# Patient Record
Sex: Male | Born: 1970 | Race: White | Hispanic: Yes | Marital: Married | Smoking: Never smoker
Health system: Southern US, Community
[De-identification: ages and names within clinical notes are randomized; demographics above are authoritative.]

## PROBLEM LIST (undated history)

## (undated) DIAGNOSIS — J302 Other seasonal allergic rhinitis: Secondary | ICD-10-CM

## (undated) DIAGNOSIS — K219 Gastro-esophageal reflux disease without esophagitis: Secondary | ICD-10-CM

## (undated) DIAGNOSIS — J309 Allergic rhinitis, unspecified: Secondary | ICD-10-CM

## (undated) DIAGNOSIS — Z973 Presence of spectacles and contact lenses: Secondary | ICD-10-CM

## (undated) DIAGNOSIS — M1990 Unspecified osteoarthritis, unspecified site: Secondary | ICD-10-CM

## (undated) DIAGNOSIS — N4340 Spermatocele of epididymis, unspecified: Secondary | ICD-10-CM

## (undated) DIAGNOSIS — J45991 Cough variant asthma: Secondary | ICD-10-CM

## (undated) HISTORY — PX: HERNIA REPAIR: SHX51

## (undated) HISTORY — DX: Other seasonal allergic rhinitis: J30.2

## (undated) HISTORY — PX: LAPAROSCOPIC CHOLECYSTECTOMY: SUR755

## (undated) HISTORY — PX: CHOLECYSTECTOMY: SHX55

## (undated) HISTORY — PX: INGUINAL HERNIA REPAIR: SUR1180

---

## 2004-12-14 ENCOUNTER — Emergency Department (HOSPITAL_COMMUNITY): Admission: EM | Admit: 2004-12-14 | Discharge: 2004-12-14 | Payer: Self-pay | Admitting: Emergency Medicine

## 2004-12-16 ENCOUNTER — Encounter: Admission: RE | Admit: 2004-12-16 | Discharge: 2004-12-16 | Payer: Self-pay | Admitting: Family Medicine

## 2004-12-29 ENCOUNTER — Ambulatory Visit (HOSPITAL_COMMUNITY): Admission: RE | Admit: 2004-12-29 | Discharge: 2004-12-29 | Payer: Self-pay | Admitting: Family Medicine

## 2007-02-01 ENCOUNTER — Emergency Department (HOSPITAL_COMMUNITY): Admission: EM | Admit: 2007-02-01 | Discharge: 2007-02-01 | Payer: Self-pay | Admitting: Emergency Medicine

## 2007-04-09 ENCOUNTER — Ambulatory Visit (HOSPITAL_COMMUNITY): Admission: RE | Admit: 2007-04-09 | Discharge: 2007-04-09 | Payer: Self-pay | Admitting: Sports Medicine

## 2007-04-11 DIAGNOSIS — R0789 Other chest pain: Secondary | ICD-10-CM | POA: Insufficient documentation

## 2007-04-16 ENCOUNTER — Ambulatory Visit: Payer: Self-pay | Admitting: Gastroenterology

## 2007-04-17 ENCOUNTER — Ambulatory Visit: Payer: Self-pay | Admitting: Gastroenterology

## 2007-04-20 ENCOUNTER — Ambulatory Visit (HOSPITAL_COMMUNITY): Admission: RE | Admit: 2007-04-20 | Discharge: 2007-04-20 | Payer: Self-pay | Admitting: Gastroenterology

## 2007-06-26 ENCOUNTER — Encounter: Admission: RE | Admit: 2007-06-26 | Discharge: 2007-06-26 | Payer: Self-pay | Admitting: Sports Medicine

## 2007-08-28 ENCOUNTER — Emergency Department (HOSPITAL_COMMUNITY): Admission: EM | Admit: 2007-08-28 | Discharge: 2007-08-28 | Payer: Self-pay | Admitting: Emergency Medicine

## 2008-05-14 ENCOUNTER — Encounter: Payer: Self-pay | Admitting: Gastroenterology

## 2008-06-12 ENCOUNTER — Encounter: Payer: Self-pay | Admitting: Gastroenterology

## 2008-07-01 ENCOUNTER — Encounter: Payer: Self-pay | Admitting: Gastroenterology

## 2008-07-17 ENCOUNTER — Ambulatory Visit: Payer: Self-pay | Admitting: Gastroenterology

## 2008-07-17 DIAGNOSIS — K7689 Other specified diseases of liver: Secondary | ICD-10-CM

## 2008-07-17 DIAGNOSIS — K922 Gastrointestinal hemorrhage, unspecified: Secondary | ICD-10-CM | POA: Insufficient documentation

## 2008-07-23 ENCOUNTER — Ambulatory Visit: Payer: Self-pay | Admitting: Gastroenterology

## 2008-07-23 ENCOUNTER — Encounter: Payer: Self-pay | Admitting: Gastroenterology

## 2008-07-24 ENCOUNTER — Telehealth: Payer: Self-pay | Admitting: Gastroenterology

## 2008-07-25 ENCOUNTER — Encounter: Payer: Self-pay | Admitting: Gastroenterology

## 2008-12-08 ENCOUNTER — Ambulatory Visit: Payer: Self-pay | Admitting: Internal Medicine

## 2008-12-08 DIAGNOSIS — J45991 Cough variant asthma: Secondary | ICD-10-CM

## 2009-07-27 ENCOUNTER — Ambulatory Visit: Payer: Self-pay | Admitting: Pulmonary Disease

## 2009-07-27 ENCOUNTER — Telehealth (INDEPENDENT_AMBULATORY_CARE_PROVIDER_SITE_OTHER): Payer: Self-pay | Admitting: *Deleted

## 2009-07-27 DIAGNOSIS — J019 Acute sinusitis, unspecified: Secondary | ICD-10-CM | POA: Insufficient documentation

## 2011-02-08 NOTE — Assessment & Plan Note (Signed)
[facility]                         GASTROENTEROLOGY OFFICE NOTE   NAME:Jeremiah Williams, Jeremiah Williams                      MRN:          [id number]  DATE:04/16/2007                            DOB:          12-04-1970    OFFICE CONSULTATION:   REASON FOR REFERRAL:  Chest pain and epigastric pain.   HISTORY OF PRESENT ILLNESS:  [name] is a 40 year old white male  referred through the courtesy of Dr. Frazier Butt.  The patient relates a  two year history of intermittent lower chest pain and epigastric pain.  His symptoms are not related to exertion or any digestive function. He  states that he feels short of breath at times during the episodes of  pain.  His pain tends to last for hours at a time and sometimes may last  for several days.  On one occasion his symptoms were associated with  nausea and vomiting.  Occasionally the pain has radiated through to his  midback.  He notes no dysphagia, odynophagia, change in bowel habits,  melena, hematochezia, hematemesis, change in stool caliber, diarrhea,  constipation or weight loss.  He has had two visits to the [facility] emergency dept. and the most recent was in May 2008.  A chest x-  ray, CBC and cardiac enzymes were all negative at that visit.  A recent  abdominal ultrasound showed fatty infiltration of the liver and the  pancreas was not well-imaged.  The remainder of the exam was normal.  He  has used Tums on occasion for these symptoms with no change in symptoms.  He was recently placed on Protonix, also with no change in symptoms. He  states he had a stress test about 2 years ago that was normal. 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  He denies any  aspirin and NSAID usage. He drinks a modest amount of alcohol.   FAMILY HISTORY:  Remarkable for two maternal uncles with pancreatic  cancer and two other maternal uncles who have had colon resections for  unknown disorders.   There is no family history of colon cancer, colon  polyps, or inflammatory bowel disease.   PAST MEDICAL HISTORY:  Status post bilateral inguinal hernia repairs as  a child and again at age 42.   CURRENT MEDICATIONS:  1. Protonix 40 mg p.o. q.a.m.  2. Tums p.r.n.   MEDICATION ALLERGIES:  None known.   Social history and review of systems per the handwritten form.   PHYSICAL EXAMINATION:  Overweight, in no acute distress.  Height 5 feet 4 inches, weight 225.8 pounds.  Blood pressure is 110/78,  pulse 76 and regular.  HEENT:  Anicteric sclerae.  Oropharynx clear.  NECK:  Without thyromegaly or adenopathy appreciated.  CHEST:  Clear to auscultation bilaterally.  There is minimal tenderness  over his xiphoid process and lower sternum.  CARDIAC:  Regular rate and rhythm without murmurs appreciated.  ABDOMEN:  Soft, nontender, nondistended, normoactive bowel sounds.  No  palpable organomegaly, masses or hernias.  RECTAL:  Deferred.  NEUROLOGIC:  Alert and oriented x3.  Grossly nonfocal.   ASSESSMENT AND PLAN:  Intermittent lower chest pain with xiphoid area  tenderness on exam today.  His symptoms have been associated with  shortness of breath on occasion.  Rule out musculosketetal pain and  other nongastrointestinal disorders. His symptoms do not have typical  features of a gastrointestinal disorder and have not responded to Tums  and Protonix; however, GERD, esophagitis, ulcer disease, gastritis,  duodenitis and acalculous cholecystitis need to be further excluded.  Proceed with upper endoscopy.  Risks, benefits, and alternatives to  upper endoscopy with possible biopsy discussed with the patient and he  consents to proceed.  This will be scheduled electively.  If this  examination is not diagnostic, we will proceed with a CCK-stimulated  hepatobiliary scan. If the above evalutation is negative he will return  to Dr. Margaretha Sheffield for further management.     Venita Lick. Russella Dar, MD,  [facility]  Electronically Signed    MTS/MedQ  DD: 04/16/2007  DT: 04/16/2007  Job #: [id number]   cc:   Frazier Butt, D.O.

## 2011-07-04 LAB — I-STAT 8, (EC8 V) (CONVERTED LAB)
BUN: 7
Hemoglobin: 16
Sodium: 139
pCO2, Ven: 37.9 — ABNORMAL LOW

## 2011-07-04 LAB — URINALYSIS, ROUTINE W REFLEX MICROSCOPIC
Hgb urine dipstick: NEGATIVE
Ketones, ur: NEGATIVE
Specific Gravity, Urine: 1.017
pH: 7.5

## 2011-07-04 LAB — POCT CARDIAC MARKERS
Operator id: 294341
Troponin i, poc: <0.05

## 2011-07-04 LAB — DIFFERENTIAL
Lymphocytes Relative: 28
Lymphs Abs: 2.2
Monocytes Absolute: 0.7
Monocytes Relative: 9
Neutrophils Relative %: 62

## 2011-07-04 LAB — CBC: MCV: 90.5

## 2011-07-04 LAB — POCT I-STAT CREATININE: Creatinine, Ser: 1

## 2011-09-27 HISTORY — PX: LAPAROSCOPIC CHOLECYSTECTOMY: SUR755

## 2013-03-02 ENCOUNTER — Emergency Department (HOSPITAL_COMMUNITY)
Admission: EM | Admit: 2013-03-02 | Discharge: 2013-03-03 | Disposition: A | Discharge status: Home / Self Care | Payer: 59 | Attending: Emergency Medicine | Admitting: Emergency Medicine

## 2013-03-02 DIAGNOSIS — S0181XA Laceration without foreign body of other part of head, initial encounter: Secondary | ICD-10-CM

## 2013-03-02 DIAGNOSIS — S335XXA Sprain of ligaments of lumbar spine, initial encounter: Secondary | ICD-10-CM | POA: Insufficient documentation

## 2013-03-02 DIAGNOSIS — S01501A Unspecified open wound of lip, initial encounter: Secondary | ICD-10-CM | POA: Insufficient documentation

## 2013-03-02 DIAGNOSIS — Z23 Encounter for immunization: Secondary | ICD-10-CM | POA: Insufficient documentation

## 2013-03-02 DIAGNOSIS — S20229A Contusion of unspecified back wall of thorax, initial encounter: Secondary | ICD-10-CM

## 2013-03-02 DIAGNOSIS — S161XXA Strain of muscle, fascia and tendon at neck level, initial encounter: Secondary | ICD-10-CM

## 2013-03-02 DIAGNOSIS — Y9389 Activity, other specified: Secondary | ICD-10-CM | POA: Insufficient documentation

## 2013-03-02 DIAGNOSIS — Y92410 Unspecified street and highway as the place of occurrence of the external cause: Secondary | ICD-10-CM | POA: Insufficient documentation

## 2013-03-02 DIAGNOSIS — IMO0002 Reserved for concepts with insufficient information to code with codable children: Secondary | ICD-10-CM | POA: Insufficient documentation

## 2013-03-02 DIAGNOSIS — S0180XA Unspecified open wound of other part of head, initial encounter: Secondary | ICD-10-CM | POA: Insufficient documentation

## 2013-03-02 DIAGNOSIS — M545 Low back pain: Secondary | ICD-10-CM

## 2013-03-02 DIAGNOSIS — S39012A Strain of muscle, fascia and tendon of lower back, initial encounter: Secondary | ICD-10-CM

## 2013-03-02 DIAGNOSIS — S139XXA Sprain of joints and ligaments of unspecified parts of neck, initial encounter: Secondary | ICD-10-CM | POA: Insufficient documentation

## 2013-03-02 MED ORDER — NAPROXEN 250 MG PO TABS
500.0000 mg | ORAL_TABLET | Freq: Once | ORAL | Status: AC
  Administered 2013-03-03: 500 mg via ORAL
  Filled 2013-03-02: qty 2

## 2013-03-02 MED ORDER — NAPROXEN 500 MG PO TABS: 500.0000 mg | ORAL_TABLET | Freq: Two times a day (BID) | ORAL | Status: DC | PRN

## 2013-03-02 MED ORDER — TETANUS-DIPHTH-ACELL PERTUSSIS 5-2.5-18.5 LF-MCG/0.5 IM SUSP
0.5000 mL | Freq: Once | INTRAMUSCULAR | Status: AC
  Administered 2013-03-03: 0.5 mL via INTRAMUSCULAR
  Filled 2013-03-02: qty 0.5

## 2013-03-02 MED ORDER — HYDROCODONE-ACETAMINOPHEN 5-325 MG PO TABS: 1.0000 | ORAL_TABLET | Freq: Four times a day (QID) | ORAL | Status: DC | PRN

## 2013-03-02 MED ORDER — METHOCARBAMOL 750 MG PO TABS: 750.0000 mg | ORAL_TABLET | Freq: Four times a day (QID) | ORAL | Status: DC | PRN

## 2013-03-02 MED ORDER — OXYCODONE-ACETAMINOPHEN 5-325 MG PO TABS
2.0000 | ORAL_TABLET | Freq: Once | ORAL | Status: AC
  Administered 2013-03-03: 2 via ORAL
  Filled 2013-03-02: qty 2

## 2013-03-02 MED ORDER — DIAZEPAM 5 MG PO TABS
10.0000 mg | ORAL_TABLET | Freq: Once | ORAL | Status: AC
  Administered 2013-03-03: 10 mg via ORAL
  Filled 2013-03-02: qty 2
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
## 2013-03-03 ENCOUNTER — Emergency Department (HOSPITAL_COMMUNITY): Payer: 59

## 2013-03-03 NOTE — ED Provider Notes (Signed)
  Medical screening examination/treatment/procedure(s) were performed by non-physician practitioner and as supervising physician I was immediately available for consultation/collaboration.    Vida Roller, MD 03/03/13 2116

## 2013-08-09 ENCOUNTER — Other Ambulatory Visit: Payer: Self-pay | Admitting: Otolaryngology

## 2013-08-09 DIAGNOSIS — R221 Localized swelling, mass and lump, neck: Secondary | ICD-10-CM

## 2013-08-14 ENCOUNTER — Ambulatory Visit
Admission: RE | Admit: 2013-08-14 | Discharge: 2013-08-14 | Disposition: A | Discharge status: Home / Self Care | Payer: 59 | Source: Ambulatory Visit | Attending: Otolaryngology | Admitting: Otolaryngology

## 2013-08-14 DIAGNOSIS — R221 Localized swelling, mass and lump, neck: Secondary | ICD-10-CM

## 2013-10-07 ENCOUNTER — Encounter (HOSPITAL_COMMUNITY): Payer: Self-pay | Admitting: Emergency Medicine

## 2013-10-07 ENCOUNTER — Emergency Department (HOSPITAL_COMMUNITY)
Admission: EM | Admit: 2013-10-07 | Discharge: 2013-10-07 | Disposition: A | Discharge status: Home / Self Care | Payer: 59 | Attending: Emergency Medicine | Admitting: Emergency Medicine

## 2013-10-07 ENCOUNTER — Emergency Department (HOSPITAL_COMMUNITY): Payer: 59

## 2013-10-07 DIAGNOSIS — R51 Headache: Secondary | ICD-10-CM | POA: Insufficient documentation

## 2013-10-07 DIAGNOSIS — H5340 Unspecified visual field defects: Secondary | ICD-10-CM | POA: Insufficient documentation

## 2013-10-07 DIAGNOSIS — R519 Headache, unspecified: Secondary | ICD-10-CM

## 2013-10-07 LAB — POCT I-STAT, CHEM 8
BUN: 7 mg/dL (ref 6–23)
CALCIUM ION: 1.19 mmol/L (ref 1.12–1.23)
CREATININE: 1.1 mg/dL (ref 0.50–1.35)
Chloride: 101 mEq/L (ref 96–112)
GLUCOSE: 106 mg/dL — AB (ref 70–99)
HCT: 44 % (ref 39.0–52.0)
HEMOGLOBIN: 15 g/dL (ref 13.0–17.0)
Potassium: 3.7 mEq/L (ref 3.7–5.3)
Sodium: 140 mEq/L (ref 137–147)
TCO2: 29 mmol/L (ref 0–100)

## 2013-10-07 LAB — CSF CELL COUNT WITH DIFFERENTIAL
RBC COUNT CSF: 22 /mm3 — AB
RBC Count, CSF: 29 /mm3 — ABNORMAL HIGH
Tube #: 1
WBC CSF: 0 /mm3 (ref 0–5)
WBC, CSF: 1 /mm3 (ref 0–5)

## 2013-10-07 LAB — CBC WITH DIFFERENTIAL/PLATELET
Basophils Absolute: 0 10*3/uL (ref 0.0–0.1)
Basophils Relative: 0 % (ref 0–1)
EOS ABS: 0.1 10*3/uL (ref 0.0–0.7)
Eosinophils Relative: 1 % (ref 0–5)
HEMATOCRIT: 42.8 % (ref 39.0–52.0)
HEMOGLOBIN: 15.6 g/dL (ref 13.0–17.0)
LYMPHS ABS: 1.4 10*3/uL (ref 0.7–4.0)
Lymphocytes Relative: 16 % (ref 12–46)
MCH: 33.3 pg (ref 26.0–34.0)
MCHC: 36.4 g/dL — AB (ref 30.0–36.0)
MCV: 91.3 fL (ref 78.0–100.0)
MONO ABS: 0.7 10*3/uL (ref 0.1–1.0)
MONOS PCT: 7 % (ref 3–12)
NEUTROS PCT: 76 % (ref 43–77)
Neutro Abs: 6.7 10*3/uL (ref 1.7–7.7)
Platelets: 233 10*3/uL (ref 150–400)
RBC: 4.69 MIL/uL (ref 4.22–5.81)
RDW: 13 % (ref 11.5–15.5)
WBC: 8.8 10*3/uL (ref 4.0–10.5)

## 2013-10-07 LAB — GRAM STAIN

## 2013-10-07 LAB — PROTEIN, CSF: Total  Protein, CSF: 32 mg/dL (ref 15–45)

## 2013-10-07 LAB — GLUCOSE, CSF: Glucose, CSF: 58 mg/dL (ref 43–76)

## 2013-10-07 MED ORDER — SODIUM CHLORIDE 0.9 % IV BOLUS (SEPSIS)
1000.0000 mL | Freq: Once | INTRAVENOUS | Status: AC
  Administered 2013-10-07: 1000 mL via INTRAVENOUS

## 2013-10-07 MED ORDER — METOCLOPRAMIDE HCL 5 MG/ML IJ SOLN
10.0000 mg | Freq: Once | INTRAMUSCULAR | Status: AC
  Administered 2013-10-07: 10 mg via INTRAVENOUS
  Filled 2013-10-07: qty 2

## 2013-10-07 MED ORDER — DIPHENHYDRAMINE HCL 50 MG/ML IJ SOLN
25.0000 mg | Freq: Once | INTRAMUSCULAR | Status: AC
  Administered 2013-10-07: 25 mg via INTRAVENOUS
  Filled 2013-10-07: qty 1

## 2013-10-07 MED ORDER — IOHEXOL 350 MG/ML SOLN
50.0000 mL | Freq: Once | INTRAVENOUS | Status: AC | PRN
  Administered 2013-10-07: 75 mL via INTRAVENOUS

## 2013-10-07 MED ORDER — KETOROLAC TROMETHAMINE 30 MG/ML IJ SOLN
30.0000 mg | Freq: Once | INTRAMUSCULAR | Status: AC
  Administered 2013-10-07: 30 mg via INTRAVENOUS
  Filled 2013-10-07: qty 1

## 2013-10-07 NOTE — ED Notes (Signed)
Pt signed consent form. LP tray at bedside.

## 2013-10-07 NOTE — ED Notes (Signed)
Pt reports lifting weights during his lunch breat at work and developed "the worst headache ever". Called EMS. Headache was 10/10 but now 3/10. Pt neuro intact, alert and oriented x 4.

## 2013-10-07 NOTE — ED Notes (Signed)
Pt in radiology 
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
## 2013-10-07 NOTE — ED Notes (Signed)
Pt returned from radiology.

## 2013-10-07 NOTE — ED Notes (Signed)
Pt here for headache on set after working out today and taking a shower, pt sts never had headache that bad before, pt reports seeing spots and dizzy.

## 2013-10-07 NOTE — Procedures (Signed)
CLINICAL DATA: [Severe headache.  ]  EXAM:  DIAGNOSTIC LUMBAR PUNCTURE UNDER FLUOROSCOPIC GUIDANCE  FLUOROSCOPY TIME: [0 min, 30 seconds]  PROCEDURE:  I discussed the risks (including hemorrhage, infection, headache, and nerve damage, among others), benefits, and alternatives to fluoroscopically guided lumbar puncture with the patient.  We specifically discussed the high technical likelihood of success of the procedure. The patient understood and elected to undergo the procedure.      Standard time-out was employed.  Following sterile skin prep and local anesthetic administration consisting of 1 percent lidocaine, a 22 gauge spinal needle was advanced without difficulty into the thecal sac at the at the [L3-4] level.  Clear CSF was returned.  Opening pressure was [21 cm of water].      12 cc of clear CSF was collected.  The needle was subsequently removed and the skin cleansed and bandaged.  No immediate complications were observed.       IMPRESSION: [ Technically successful fluoroscopically guided lumbar puncture.  Clear CSF was returned in sent to the lab.  Opening pressure minimally elevated at 21 cm of water.   ]

## 2013-10-07 NOTE — ED Provider Notes (Signed)
CSN: [id number]     Arrival date & time 10/07/13  1254 History   First MD Initiated Contact with Patient 10/07/13 1255     Chief Complaint  Patient presents with  . Headache   HPI  43 y/o male with no significant past medical history who presents with cc of headache. The patient was in his usual state of health until this afternoon when he developed a sudden onset headache. The patient states he lifted weights and about 10 minutes later began to take a shower when he had a sudden onset of a sharp, 10/10, frontal headache which he describes as the worst headache of his life. He denies any fevers, chills, vomiting, neck stiffness, numbness or weakness. He has had associated blurry vision.   History reviewed. No pertinent past medical history. History reviewed. No pertinent past surgical history. History reviewed. No pertinent family history. History  Substance Use Topics  . Smoking status: Never Smoker   . Smokeless tobacco: Not on file  . Alcohol Use: Yes     Comment: social    Review of Systems  Constitutional: Negative for fever and chills.  Eyes: Positive for visual disturbance. Negative for pain.  Respiratory: Negative for shortness of breath.   Cardiovascular: Negative for chest pain.  Gastrointestinal: Negative for nausea, vomiting and abdominal pain.  Neurological: Positive for headaches. Negative for weakness and numbness.  All other systems reviewed and are negative.    Allergies  Review of patient's allergies indicates no known allergies.  Home Medications   Current Outpatient Rx  Name  Route  Sig  Dispense  Refill  . dextromethorphan-guaiFENesin (MUCINEX DM) 30-600 MG per 12 hr tablet   Oral   Take 1 tablet by mouth 2 (two) times daily.         . Pseudoephedrine-APAP-DM (DAYQUIL PO)   Oral   Take 15 mLs by mouth every 4 (four) hours as needed (cold and flu symptoms).          BP 114/54  Pulse 66  Temp(Src) 98.4 F (36.9 C) (Oral)  Resp 14  Ht 5\' 4"   (1.626 m)  Wt 210 lb (95.255 kg)  BMI 36.03 kg/m2  SpO2 96% Physical Exam  Constitutional: He is oriented to person, place, and time. He appears well-developed and well-nourished. No distress.  HENT:  Head: Normocephalic and atraumatic.  Mouth/Throat: No oropharyngeal exudate.  Eyes: Conjunctivae are normal. Pupils are equal, round, and reactive to light.  Neck: Normal range of motion. Neck supple.  Cardiovascular: Normal rate and normal heart sounds.  Exam reveals no gallop and no friction rub.   No murmur heard. Pulmonary/Chest: Effort normal and breath sounds normal.  Abdominal: Soft. He exhibits no distension. There is no tenderness.  Musculoskeletal: Normal range of motion. He exhibits no edema and no tenderness.  Neurological: He is alert and oriented to person, place, and time. He has normal strength and normal reflexes. No cranial nerve deficit or sensory deficit. Coordination normal. GCS eye subscore is 4. GCS verbal subscore is 5. GCS motor subscore is 6.  Skin: Skin is warm and dry.    ED Course  LUMBAR PUNCTURE Date/Time: 10/07/2013 9:33 PM Performed by: Shanon Ace Authorized by: Shanon Ace Consent: Verbal consent obtained. written consent obtained. Risks and benefits: risks, benefits and alternatives were discussed Consent given by: patient Patient understanding: patient states understanding of the procedure being performed Patient identity confirmed: verbally with patient Time out: Immediately prior to procedure a "time out" was called to  verify the correct patient, procedure, equipment, support staff and site/side marked as required. Indications: evaluation for subarachnoid hemorrhage Anesthesia: local infiltration Local anesthetic: lidocaine 1% without epinephrine Anesthetic total: 5 ml Preparation: Patient was prepped and draped in the usual sterile fashion. Lumbar space: L4-L5 interspace Patient's position: left lateral decubitus Needle gauge:  20 Needle type: spinal needle - Quincke tip Needle length: 3.5 in Number of attempts: 2 Fluid appearance: bloody (<1 cc of blooy fluid returned. ) Tubes of fluid: No fluid was collected as fluid appeared to be traumatic.  Post-procedure: site cleaned and adhesive bandage applied   (including critical care time) Labs Review Labs Reviewed  CBC WITH DIFFERENTIAL - Abnormal; Notable for the following:    MCHC 36.4 (*)    All other components within normal limits  CSF CELL COUNT WITH DIFFERENTIAL - Abnormal; Notable for the following:    Appearance, CSF CLEAR (*)    RBC Count, CSF 29 (*)    All other components within normal limits  CSF CELL COUNT WITH DIFFERENTIAL - Abnormal; Notable for the following:    Appearance, CSF CLEAR (*)    RBC Count, CSF 22 (*)    All other components within normal limits  POCT I-STAT, CHEM 8 - Abnormal; Notable for the following:    Glucose, Bld 106 (*)    All other components within normal limits  GRAM STAIN  CSF CULTURE  PROTEIN, CSF  GLUCOSE, CSF   Imaging Review Ct Angio Head W/cm &/or Wo Cm  10/07/2013   CLINICAL DATA:  Worst headache of life this morning, since resolved.  EXAM: CT ANGIOGRAPHY HEAD  TECHNIQUE: Multidetector CT imaging of the head was performed using the standard protocol during bolus administration of intravenous contrast. Multiplanar CT image reconstructions including MIPs were obtained to evaluate the vascular anatomy.  CONTRAST:  50 mL Omnipaque 350  COMPARISON:  None.  FINDINGS: Nonvascular: The ventricles and sulci are within normal limits for age. There is no evidence of acute infarct, intracranial hemorrhage, mass, midline shift, or extra-axial collection. There is no abnormal enhancement. The orbits are unremarkable. The mastoid air cells are clear. Mild bilateral maxillary and sphenoid sinus mucosal thickening is noted. There is no evidence of acute fracture.  Vascular: The visualized distal vertebral arteries are patent. Left  vertebral artery is dominant. PICA origins are patent bilaterally. AICA origins are patent. Basilar artery is patent without stenosis. SCAs are patent. PCA origins and visualized branches are unremarkable. A left posterior communicating artery is identified. Internal carotid arteries are patent from skullbase to carotid terminus. ACA and MCA origins and visualized branches are patent and unremarkable. There is a small, patent anterior communicating artery. No intracranial aneurysm is identified.  Review of the MIP images confirms the above findings.  IMPRESSION: 1. No evidence of acute intracranial abnormality. 2. Unremarkable head CTA.   Electronically Signed   By: Sebastian AcheAllen  Grady   On: 10/07/2013 14:28   Dg Lumbar Puncture Fluoro Guide  10/07/2013   CLINICAL DATA:  Severe headache.  EXAM: DIAGNOSTIC LUMBAR PUNCTURE UNDER FLUOROSCOPIC GUIDANCE  FLUOROSCOPY TIME:  0 min, 30 seconds  PROCEDURE: I discussed the risks (including hemorrhage, infection, headache, and nerve damage, among others), benefits, and alternatives to fluoroscopically guided lumbar puncture with the patient. We specifically discussed the high technical likelihood of success of the procedure. The patient understood and elected to undergo the procedure.  Standard time-out was employed. Following sterile skin prep and local anesthetic administration consisting of 1 percent lidocaine, a 22  gauge spinal needle was advanced without difficulty into the thecal sac at the at the L3-4 level. Clear CSF was returned. Opening pressure was 21 cm of water.  12 cc of clear CSF was collected. The needle was subsequently removed and the skin cleansed and bandaged. No immediate complications were observed.  IMPRESSION: 1. Technically successful fluoroscopically guided lumbar puncture. Clear CSF was returned in sent to the lab. Opening pressure minimally elevated at 21 cm of water.   Electronically Signed   By: Herbie Baltimore M.D.   On: 10/07/2013 18:13    EKG  Interpretation   None       MDM   Here with sudden onset worst headache of life. Afebrile. VSS. Well appearing. Non-focal neuro exam. CT/CTA negative. Discussed R/B of LP. Performed bedside LP but had traumatic tap. Will send to radiology for Fluoroscopic guided LP. Migraine cocktail given as well as Toradol. Transfer of care to Dr. Gordy Levan at 17:00 pending results of LP. If no evidence of SAH will d/c with pcp follow up.   1. Headache      Shanon Ace, MD 10/07/13 2135

## 2013-10-08 NOTE — ED Provider Notes (Signed)
Pt not seen by me. He was seen by Dr. Gwendolyn GrantWalden and signed out to Dr. Gordy LevanWalton. I was available for any consultation. Agree with the assessment above.  Derwood KaplanAnkit Tarini Carrier, MD 10/08/13 [phone]

## 2013-10-08 NOTE — ED Provider Notes (Signed)
I saw and evaluated the patient, reviewed the resident's note and I agree with the findings and plan.  EKG Interpretation   None       43 year old male presents with headache. Began 10 minutes after working out. Acute onset, worse his wife. Return for subarachnoid hemorrhage. CT, CTA negative. Will proceed with LP. I was at the bedside for Dr. Dutch QuintBringolf's LP attempt. Unable to go obtain fluid, sent to fluoroscopy for LP.  Dagmar HaitWilliam Azalie Harbeck, MD 10/08/13 [phone]

## 2013-10-11 LAB — CSF CULTURE W GRAM STAIN: Culture: NO GROWTH

## 2014-12-29 ENCOUNTER — Other Ambulatory Visit: Payer: Self-pay | Admitting: Ophthalmology

## 2015-08-14 ENCOUNTER — Encounter: Payer: Self-pay | Admitting: Gastroenterology

## 2015-08-31 ENCOUNTER — Other Ambulatory Visit: Payer: Self-pay | Admitting: Physician Assistant

## 2015-08-31 NOTE — H&P (Signed)
HPI: Jeremiah Williams is an old patient coming in with a new issue.  Off and on symptoms of both knees and both shoulders, right greater than left for both the knees and shoulders.    Bilateral knee pain longstanding for more than a year.  Off and on.  Most of this is retropatellar with some grating and crepitus and irritated by all activities that stress the patellofemoral joint.  A new episode of acute locking right knee in flexion.  Sharp pain medially.  Could not straighten his knee out.  Able to manipulate this and straighten it out.  Soreness medially since.  He has a little swelling, nothing marked. He has not had this in the past but this is fairly dramatic.  He is a very active with BMX biking.    Both shoulders.  The left is actually very tolerable.  A little annoying.  The right is much worse.  Worse over the head, worse with cuff irritation. All activities overhead are bothersome.  No rest pain, no night pain.  No specific traumatic event.    History and general examination is reviewed.  EXAMINATION: Specifically a  healthy 44 year old male.  5'4", 220 pounds.  A little bit of an antalgic gait on the right.  Upper extremities: full motion.  Very mild impingement on the left.   On the right, much more dramatic impingement.  Positive palms down abduction.  A little give way weakness.  Biceps intact.  No apprehension or instability.  A little sore over the [facility] joint.  Lower extremity examination: negative straight leg raise both sides.  Negative log roll both hips.  Both knees have full motion.  Mild to moderate crepitus patellofemoral both sides.  Tracking not bad.  Q-angle not back.  Ligaments stable.  Right knee: point tender medial joint line. Pain with McMurray's.  I cannot palpate an obvious loose body.  Neurovascularly intact distally.    X-RAYS: 3-view x-ray right shoulder shows marked changes of the [facility] joint with spurring.  Type III acromion.  Reasonable subacromial space glenohumeral joint.   Nothing that looks acute.  4-standing x-ray both knees shows some mild to moderate changes of the patellofemoral joint.  Other compartments preserved.  No obvious loose body.     DISPOSITION: 1. In regards to his shoulder, he has obvious impingement.  We are going to try a subacromial injection and home exercise program.  Next step would be an MRI to look at his cuff.  He will let me know depending on response to injection.  I don't think we need to do anything to the left other than have him proceed with Jobe exercises there.   2. Bilateral chondromalacia patella.   He has had one episode of acute locking on the right.  I don't know if this was a loose body or he hung up on his patellofemoral joint or if he has a meniscus tear.  He is not that symptomatic now but if this persists, the next step would be an MRI to looks for loose body and medial meniscus tear.  He will let me know.  PROCEDURE NOTE: The patient's clinical condition is marked by substantial pain and/or significant functional disability. Other conservative therapy has not provided relief, is contraindicated, or not appropriate. There is a reasonable likelihood that injection will significantly improve the patient's pain and/or functional disability.   After appropriate consent, under sterile technique, subacromial injection right shoulder with Depo-Medrol 40 mg. and 4 cc's of Marcaine.  He  tolerated this well.      [name]. Murphy, M.D.   Addendum:  I went over his MRI.  Transient improvement from his injection with Marcaine.  This is now longstanding a number of years.  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  Although his cuff is intact I think there is a picture consistent with impingement with abrasive changes on the top of the cuff.  I have spoken with Jeremiah MalkinZach and his wife, Jeremiah Williams.  They would like to pursue definitive treatment, which I think is in order and I don't think this is going to resolve on its  own.  Exam under anesthesia, arthroscopy, subacromial decompression and distal clavicle excision.  Procedure, risks, benefits and complications reviewed.  Paperwork complete.  All questions answered.  I will see him at the time of operative intervention.    [name]. Murphy, M.D.

## 2015-09-04 ENCOUNTER — Encounter (HOSPITAL_BASED_OUTPATIENT_CLINIC_OR_DEPARTMENT_OTHER): Payer: Self-pay | Admitting: *Deleted

## 2015-09-10 ENCOUNTER — Encounter (HOSPITAL_BASED_OUTPATIENT_CLINIC_OR_DEPARTMENT_OTHER)
Admission: RE | Disposition: A | Discharge status: Home / Self Care | Payer: Self-pay | Source: Ambulatory Visit | Attending: Orthopedic Surgery

## 2015-09-10 ENCOUNTER — Encounter (HOSPITAL_BASED_OUTPATIENT_CLINIC_OR_DEPARTMENT_OTHER): Payer: Self-pay | Admitting: *Deleted

## 2015-09-10 ENCOUNTER — Ambulatory Visit (HOSPITAL_BASED_OUTPATIENT_CLINIC_OR_DEPARTMENT_OTHER)
Admission: RE | Admit: 2015-09-10 | Discharge: 2015-09-10 | Disposition: A | Discharge status: Home / Self Care | Payer: 59 | Source: Ambulatory Visit | Attending: Orthopedic Surgery | Admitting: Orthopedic Surgery

## 2015-09-10 ENCOUNTER — Ambulatory Visit (HOSPITAL_BASED_OUTPATIENT_CLINIC_OR_DEPARTMENT_OTHER): Payer: 59 | Admitting: Anesthesiology

## 2015-09-10 DIAGNOSIS — M7541 Impingement syndrome of right shoulder: Secondary | ICD-10-CM | POA: Diagnosis not present

## 2015-09-10 DIAGNOSIS — M19011 Primary osteoarthritis, right shoulder: Secondary | ICD-10-CM | POA: Diagnosis not present

## 2015-09-10 DIAGNOSIS — M75101 Unspecified rotator cuff tear or rupture of right shoulder, not specified as traumatic: Secondary | ICD-10-CM | POA: Insufficient documentation

## 2015-09-10 HISTORY — PX: SHOULDER ARTHROSCOPY WITH DISTAL CLAVICLE RESECTION: SHX5675

## 2015-09-10 HISTORY — DX: Unspecified osteoarthritis, unspecified site: M19.90

## 2015-09-10 SURGERY — SHOULDER ARTHROSCOPY WITH DISTAL CLAVICLE RESECTION
Anesthesia: General | Site: Shoulder | Laterality: Right

## 2015-09-10 MED ORDER — BUPIVACAINE-EPINEPHRINE (PF) 0.5% -1:200000 IJ SOLN
INTRAMUSCULAR | Status: DC | PRN
  Administered 2015-09-10: 20 mL via PERINEURAL

## 2015-09-10 MED ORDER — LIDOCAINE HCL (CARDIAC) 20 MG/ML IV SOLN
INTRAVENOUS | Status: AC
  Filled 2015-09-10: qty 5

## 2015-09-10 MED ORDER — ONDANSETRON HCL 4 MG PO TABS: 4.0000 mg | ORAL_TABLET | Freq: Three times a day (TID) | ORAL | Status: DC | PRN

## 2015-09-10 MED ORDER — SUCCINYLCHOLINE CHLORIDE 20 MG/ML IJ SOLN
INTRAMUSCULAR | Status: DC | PRN
  Administered 2015-09-10: 100 mg via INTRAVENOUS

## 2015-09-10 MED ORDER — METHOCARBAMOL 500 MG PO TABS: 500.0000 mg | ORAL_TABLET | Freq: Four times a day (QID) | ORAL | Status: DC | PRN

## 2015-09-10 MED ORDER — FENTANYL CITRATE (PF) 100 MCG/2ML IJ SOLN
INTRAMUSCULAR | Status: AC
  Filled 2015-09-10: qty 2

## 2015-09-10 MED ORDER — LACTATED RINGERS IV SOLN: INTRAVENOUS | Status: DC

## 2015-09-10 MED ORDER — LACTATED RINGERS IV SOLN
INTRAVENOUS | Status: DC
  Administered 2015-09-10: 10 mL/h via INTRAVENOUS

## 2015-09-10 MED ORDER — CEFAZOLIN SODIUM-DEXTROSE 2-3 GM-% IV SOLR
INTRAVENOUS | Status: AC
  Filled 2015-09-10: qty 50

## 2015-09-10 MED ORDER — CEFAZOLIN SODIUM-DEXTROSE 2-3 GM-% IV SOLR
2.0000 g | INTRAVENOUS | Status: AC
  Administered 2015-09-10: 2 g via INTRAVENOUS

## 2015-09-10 MED ORDER — CHLORHEXIDINE GLUCONATE 4 % EX LIQD: 60.0000 mL | Freq: Once | CUTANEOUS | Status: DC

## 2015-09-10 MED ORDER — MIDAZOLAM HCL 2 MG/2ML IJ SOLN
1.0000 mg | INTRAMUSCULAR | Status: DC | PRN
  Administered 2015-09-10: 2 mg via INTRAVENOUS

## 2015-09-10 MED ORDER — OXYCODONE-ACETAMINOPHEN 5-325 MG PO TABS: 1.0000 | ORAL_TABLET | ORAL | Status: DC | PRN

## 2015-09-10 MED ORDER — PROMETHAZINE HCL 25 MG/ML IJ SOLN: 6.2500 mg | INTRAMUSCULAR | Status: DC | PRN

## 2015-09-10 MED ORDER — FENTANYL CITRATE (PF) 100 MCG/2ML IJ SOLN
25.0000 ug | INTRAMUSCULAR | Status: DC | PRN
  Administered 2015-09-10: 50 ug via INTRAVENOUS

## 2015-09-10 MED ORDER — LIDOCAINE HCL (CARDIAC) 20 MG/ML IV SOLN
INTRAVENOUS | Status: DC | PRN
  Administered 2015-09-10: 100 mg via INTRAVENOUS

## 2015-09-10 MED ORDER — DEXAMETHASONE SODIUM PHOSPHATE 10 MG/ML IJ SOLN
INTRAMUSCULAR | Status: AC
  Filled 2015-09-10: qty 1

## 2015-09-10 MED ORDER — DEXAMETHASONE SODIUM PHOSPHATE 4 MG/ML IJ SOLN
INTRAMUSCULAR | Status: DC | PRN
  Administered 2015-09-10: 10 mg via INTRAVENOUS

## 2015-09-10 MED ORDER — ONDANSETRON HCL 4 MG/2ML IJ SOLN: 4.0000 mg | Freq: Four times a day (QID) | INTRAMUSCULAR | Status: DC | PRN

## 2015-09-10 MED ORDER — METOCLOPRAMIDE HCL 5 MG PO TABS: 5.0000 mg | ORAL_TABLET | Freq: Three times a day (TID) | ORAL | Status: DC | PRN

## 2015-09-10 MED ORDER — FENTANYL CITRATE (PF) 100 MCG/2ML IJ SOLN
50.0000 ug | INTRAMUSCULAR | Status: DC | PRN
  Administered 2015-09-10 (×2): 100 ug via INTRAVENOUS

## 2015-09-10 MED ORDER — HYDROMORPHONE HCL 1 MG/ML IJ SOLN: 0.5000 mg | INTRAMUSCULAR | Status: DC | PRN

## 2015-09-10 MED ORDER — ONDANSETRON HCL 4 MG PO TABS: 4.0000 mg | ORAL_TABLET | Freq: Four times a day (QID) | ORAL | Status: DC | PRN

## 2015-09-10 MED ORDER — PROPOFOL 500 MG/50ML IV EMUL
INTRAVENOUS | Status: AC
  Filled 2015-09-10: qty 50

## 2015-09-10 MED ORDER — PROPOFOL 10 MG/ML IV BOLUS
INTRAVENOUS | Status: DC | PRN
  Administered 2015-09-10: 150 mg via INTRAVENOUS

## 2015-09-10 MED ORDER — GLYCOPYRROLATE 0.2 MG/ML IJ SOLN: 0.2000 mg | Freq: Once | INTRAMUSCULAR | Status: DC | PRN

## 2015-09-10 MED ORDER — METHOCARBAMOL 1000 MG/10ML IJ SOLN: 500.0000 mg | Freq: Four times a day (QID) | INTRAVENOUS | Status: DC | PRN

## 2015-09-10 MED ORDER — ONDANSETRON HCL 4 MG/2ML IJ SOLN
INTRAMUSCULAR | Status: AC
  Filled 2015-09-10: qty 2

## 2015-09-10 MED ORDER — ONDANSETRON HCL 4 MG/2ML IJ SOLN
INTRAMUSCULAR | Status: DC | PRN
Start: 2015-09-10 — End: 2015-09-10
  Administered 2015-09-10: 4 mg via INTRAVENOUS

## 2015-09-10 MED ORDER — METOCLOPRAMIDE HCL 5 MG/ML IJ SOLN: 5.0000 mg | Freq: Three times a day (TID) | INTRAMUSCULAR | Status: DC | PRN

## 2015-09-10 MED ORDER — SCOPOLAMINE 1 MG/3DAYS TD PT72: 1.0000 | MEDICATED_PATCH | Freq: Once | TRANSDERMAL | Status: DC

## 2015-09-10 MED ORDER — MIDAZOLAM HCL 2 MG/2ML IJ SOLN
INTRAMUSCULAR | Status: AC
  Filled 2015-09-10: qty 2

## 2015-09-10 SURGICAL SUPPLY — 71 items
APL SKNCLS STERI-STRIP NONHPOA (GAUZE/BANDAGES/DRESSINGS)
BENZOIN TINCTURE PRP APPL 2/3 (GAUZE/BANDAGES/DRESSINGS) IMPLANT
BLADE CUTTER GATOR 3.5 (BLADE) ×3 IMPLANT
BLADE CUTTER MENIS 5.5 (BLADE) IMPLANT
BLADE GREAT WHITE 4.2 (BLADE) ×2 IMPLANT
BLADE GREAT WHITE 4.2MM (BLADE) ×1
BLADE SURG 15 STRL LF DISP TIS (BLADE) IMPLANT
BLADE SURG 15 STRL SS (BLADE)
BUR OVAL 6.0 (BURR) ×3 IMPLANT
CANNULA DRY DOC 8X75 (CANNULA) IMPLANT
CANNULA TWIST IN 8.25X7CM (CANNULA) IMPLANT
CLOSURE WOUND 1/2 X4 (GAUZE/BANDAGES/DRESSINGS)
DECANTER SPIKE VIAL GLASS SM (MISCELLANEOUS) IMPLANT
DRAPE OEC MINIVIEW 54X84 (DRAPES) IMPLANT
DRAPE STERI 35X30 U-POUCH (DRAPES) ×3 IMPLANT
DRAPE U-SHAPE 47X51 STRL (DRAPES) ×3 IMPLANT
DRAPE U-SHAPE 76X120 STRL (DRAPES) ×6 IMPLANT
DRSG PAD ABDOMINAL 8X10 ST (GAUZE/BANDAGES/DRESSINGS) ×3 IMPLANT
DURAPREP 26ML APPLICATOR (WOUND CARE) ×3 IMPLANT
ELECT MENISCUS 165MM 90D (ELECTRODE) ×3 IMPLANT
ELECT REM PT RETURN 9FT ADLT (ELECTROSURGICAL) ×3
ELECTRODE REM PT RTRN 9FT ADLT (ELECTROSURGICAL) ×1 IMPLANT
GAUZE SPONGE 4X4 12PLY STRL (GAUZE/BANDAGES/DRESSINGS) ×6 IMPLANT
GAUZE XEROFORM 1X8 LF (GAUZE/BANDAGES/DRESSINGS) ×3 IMPLANT
GLOVE BIOGEL PI IND STRL 7.0 (GLOVE) ×1 IMPLANT
GLOVE BIOGEL PI INDICATOR 7.0 (GLOVE) ×2
GLOVE ECLIPSE 7.0 STRL STRAW (GLOVE) ×3 IMPLANT
GLOVE SURG ORTHO 8.0 STRL STRW (GLOVE) ×3 IMPLANT
GOWN STRL REUS W/ TWL LRG LVL3 (GOWN DISPOSABLE) ×2 IMPLANT
GOWN STRL REUS W/ TWL XL LVL3 (GOWN DISPOSABLE) ×1 IMPLANT
GOWN STRL REUS W/TWL LRG LVL3 (GOWN DISPOSABLE) ×6
GOWN STRL REUS W/TWL XL LVL3 (GOWN DISPOSABLE) ×3
IV NS IRRIG 3000ML ARTHROMATIC (IV SOLUTION) ×12 IMPLANT
MANIFOLD NEPTUNE II (INSTRUMENTS) ×3 IMPLANT
NDL SCORPION MULTI FIRE (NEEDLE) IMPLANT
NDL SUT 6 .5 CRC .975X.05 MAYO (NEEDLE) IMPLANT
NEEDLE MAYO TAPER (NEEDLE)
NEEDLE SCORPION MULTI FIRE (NEEDLE) IMPLANT
NS IRRIG 1000ML POUR BTL (IV SOLUTION) IMPLANT
PACK ARTHROSCOPY DSU (CUSTOM PROCEDURE TRAY) ×3 IMPLANT
PACK BASIN DAY SURGERY FS (CUSTOM PROCEDURE TRAY) ×3 IMPLANT
PASSER SUT SWANSON 36MM LOOP (INSTRUMENTS) IMPLANT
PENCIL BUTTON HOLSTER BLD 10FT (ELECTRODE) ×3 IMPLANT
SET ARTHROSCOPY TUBING (MISCELLANEOUS) ×3
SET ARTHROSCOPY TUBING LN (MISCELLANEOUS) ×1 IMPLANT
SLEEVE SCD COMPRESS KNEE MED (MISCELLANEOUS) IMPLANT
SLING ARM FOAM STRAP LRG (SOFTGOODS) IMPLANT
SLING ARM IMMOBILIZER LRG (SOFTGOODS) IMPLANT
SLING ARM IMMOBILIZER MED (SOFTGOODS) IMPLANT
SLING ARM MED ADULT FOAM STRAP (SOFTGOODS) IMPLANT
SLING ARM XL FOAM STRAP (SOFTGOODS) IMPLANT
SPONGE LAP 4X18 X RAY DECT (DISPOSABLE) IMPLANT
STRIP CLOSURE SKIN 1/2X4 (GAUZE/BANDAGES/DRESSINGS) IMPLANT
SUCTION FRAZIER TIP 10 FR DISP (SUCTIONS) IMPLANT
SUT ETHIBOND 2 OS 4 DA (SUTURE) IMPLANT
SUT ETHILON 2 0 FS 18 (SUTURE) IMPLANT
SUT ETHILON 3 0 PS 1 (SUTURE) IMPLANT
SUT FIBERWIRE #2 38 T-5 BLUE (SUTURE)
SUT RETRIEVER MED (INSTRUMENTS) IMPLANT
SUT TIGER TAPE 7 IN WHITE (SUTURE) IMPLANT
SUT VIC AB 0 CT1 27 (SUTURE)
SUT VIC AB 0 CT1 27XBRD ANBCTR (SUTURE) IMPLANT
SUT VIC AB 2-0 SH 27 (SUTURE)
SUT VIC AB 2-0 SH 27XBRD (SUTURE) IMPLANT
SUT VIC AB 3-0 FS2 27 (SUTURE) IMPLANT
SUTURE FIBERWR #2 38 T-5 BLUE (SUTURE) IMPLANT
TAPE FIBER 2MM 7IN #2 BLUE (SUTURE) IMPLANT
TOWEL OR 17X24 6PK STRL BLUE (TOWEL DISPOSABLE) ×3 IMPLANT
TOWEL OR NON WOVEN STRL DISP B (DISPOSABLE) ×3 IMPLANT
WATER STERILE IRR 1000ML POUR (IV SOLUTION) ×3 IMPLANT
YANKAUER SUCT BULB TIP NO VENT (SUCTIONS) IMPLANT

## 2015-09-10 NOTE — Anesthesia Procedure Notes (Addendum)
Anesthesia Regional Block:  Interscalene brachial plexus block  Pre-Anesthetic Checklist: ,, timeout performed, Correct Patient, Correct Site, Correct Laterality, Correct Procedure, Correct Position, site marked, Risks and benefits discussed,  Surgical consent,  Pre-op evaluation,  At surgeon's request and post-op pain management  Laterality: Right  Prep: chloraprep       Needles:  Injection technique: Single-shot  Needle Type: Echogenic Stimulator Needle     Needle Length: 9cm 9 cm Needle Gauge: 22 and 22 G    Additional Needles:  Procedures: ultrasound guided (picture in chart) and nerve stimulator Interscalene brachial plexus block Narrative:  Injection made incrementally with aspirations every 5 mL.  Performed by: Personally  Anesthesiologist: Cecile HearingURK, STEPHEN EDWARD  Additional Notes: Functioning IV was confirmed and monitors were applied.  A 50mm 22ga Arrow echogenic stimulator needle was used. Sterile prep and drape,hand hygiene and sterile gloves were used.  Negative aspiration and negative test dose prior to incremental administration of local anesthetic. The patient tolerated the procedure well.  Ultrasound guidance: relevent anatomy identified, needle position confirmed, local anesthetic spread visualized around nerve(s), vascular puncture avoided.  Image printed for medical record.    Procedure Name: Intubation Performed by: York GricePEARSON, Nathalia Wismer W Pre-anesthesia Checklist: Patient identified, Emergency Drugs available, Suction available and Patient being monitored Patient Re-evaluated:Patient Re-evaluated prior to inductionOxygen Delivery Method: Circle System Utilized Preoxygenation: Pre-oxygenation with 100% oxygen Intubation Type: IV induction Ventilation: Mask ventilation without difficulty Laryngoscope Size: Miller and 2 Grade View: Grade II Tube type: Oral Tube size: 7.0 mm Number of attempts: 1 Airway Equipment and Method: Stylet and Oral airway Placement  Confirmation: ETT inserted through vocal cords under direct vision,  positive ETCO2 and breath sounds checked- equal and bilateral Secured at: 23 cm Tube secured with: Tape Dental Injury: Teeth and Oropharynx as per pre-operative assessment

## 2015-09-10 NOTE — Anesthesia Postprocedure Evaluation (Signed)
Anesthesia Post Note  Patient: Jeremiah Williams  Procedure(s) Performed: Procedure(s) (LRB): RIGHT SHOULDER ARTHROSCOPY DEBRIDEMENT,ACROMIOPLASTY WITH DISTAL CLAVICLE  EXCISION DEBRIDE LABRUM (Right)  Patient location during evaluation: PACU Anesthesia Type: General and Regional Level of consciousness: awake and alert, awake and oriented Pain management: pain level controlled Vital Signs Assessment: post-procedure vital signs reviewed and stable Respiratory status: spontaneous breathing, nonlabored ventilation, respiratory function stable and patient connected to nasal cannula oxygen Cardiovascular status: blood pressure returned to baseline and stable Postop Assessment: no signs of nausea or vomiting Anesthetic complications: no Comments: GA + Interscalene nerve block    Last Vitals:  Filed Vitals:   09/10/15 1258 09/10/15 1300  BP:    Pulse: 81   Temp:  36.5 C  Resp: 18     Last Pain: There were no vitals filed for this visit.               Cecile HearingStephen Edward Mortimer Bair

## 2015-09-10 NOTE — Interval H&P Note (Signed)
History and Physical Interval Note:  09/10/2015 7:33 AM  Jeremiah Williams  has presented today for surgery, with the diagnosis of RIGHT SHOULDER OSTEOARTHRITIS IMPINGEMENT SYNDROME  The various methods of treatment have been discussed with the patient and family. After consideration of risks, benefits and other options for treatment, the patient has consented to  Procedure(s): RIGHT SHOULDER ARTHROSCOPY DEBRIDEMENT,ACROMIOPLASTY WITH DISTAL CLAVICLE EXCISION (Right) as a surgical intervention .  The patient's history has been reviewed, patient examined, no change in status, stable for surgery.  I have reviewed the patient's chart and labs.  Questions were answered to the patient's satisfaction.     [name] [name]

## 2015-09-10 NOTE — Anesthesia Preprocedure Evaluation (Addendum)
Anesthesia Evaluation  Patient identified by MRN, date of birth, ID band Patient awake    Reviewed: Allergy & Precautions, NPO status , Patient's Chart, lab work & pertinent test results  History of Anesthesia Complications (+) history of anesthetic complications (negative pressure pulmonary edema on emergence with prior surgery)  Airway Mallampati: II  TM Distance: <3 FB Neck ROM: Full    Dental  (+) Teeth Intact, Dental Advisory Given   Pulmonary neg pulmonary ROS,    Pulmonary exam normal breath sounds clear to auscultation       Cardiovascular Exercise Tolerance: Good (-) hypertension(-) angina(-) CAD negative cardio ROS Normal cardiovascular exam Rhythm:Regular Rate:Normal     Neuro/Psych negative neurological ROS  negative psych ROS   GI/Hepatic negative GI ROS, Neg liver ROS,   Endo/Other  negative endocrine ROSObesity   Renal/GU negative Renal ROS     Musculoskeletal  (+) Arthritis , Osteoarthritis,    Abdominal   Peds  Hematology negative hematology ROS (+)   Anesthesia Other Findings Day of surgery medications reviewed with the patient.  Reproductive/Obstetrics                            Anesthesia Physical Anesthesia Plan  ASA: II  Anesthesia Plan: General   Post-op Pain Management: GA combined w/ Regional for post-op pain   Induction: Intravenous  Airway Management Planned: Oral ETT  Additional Equipment:   Intra-op Plan:   Post-operative Plan: Extubation in OR  Informed Consent: I have reviewed the patients History and Physical, chart, labs and discussed the procedure including the risks, benefits and alternatives for the proposed anesthesia with the patient or authorized representative who has indicated his/her understanding and acceptance.   Dental advisory given  Plan Discussed with: CRNA  Anesthesia Plan Comments: (Risks/benefits of general anesthesia  discussed with patient including risk of damage to teeth, lips, gum, and tongue, nausea/vomiting, allergic reactions to medications, and the possibility of heart attack, stroke and death.  All patient questions answered.  Patient wishes to proceed.  GA + Interscalene nerve block)        Anesthesia Quick Evaluation

## 2015-09-10 NOTE — Transfer of Care (Signed)
Immediate Anesthesia Transfer of Care Note  Patient: Jeremiah Williams  Procedure(s) Performed: Procedure(s): RIGHT SHOULDER ARTHROSCOPY DEBRIDEMENT,ACROMIOPLASTY WITH DISTAL CLAVICLE  EXCISION DEBRIDE LABRUM (Right)  Patient Location: PACU  Anesthesia Type:General  Level of Consciousness: awake and sedated  Airway & Oxygen Therapy: Patient Spontanous Breathing and Patient connected to face mask oxygen  Post-op Assessment: Report given to RN and Post -op Vital signs reviewed and stable  Post vital signs: Reviewed and stable  Last Vitals:  Filed Vitals:   09/10/15 0935 09/10/15 0940  BP: 135/89   Pulse: 59 67  Temp:    Resp: 8 9    Complications: No apparent anesthesia complications

## 2015-09-10 NOTE — Progress Notes (Signed)
Assisted Dr. Turk with right, ultrasound guided, interscalene  block. Side rails up, monitors on throughout procedure. See vital signs in flow sheet. Tolerated Procedure well. 

## 2015-09-10 NOTE — Discharge Instructions (Signed)
Shouder arthroscopy, partial rotator cuff tear debridement subacromial decompression °Care After Instructions °Refer to this sheet in the next few weeks. These discharge instructions provide you with general information on caring for yourself after you leave the hospital. Your caregiver may also give you specific instructions. Your treatment has been planned according to the most current medical practices available, but unavoidable complications sometimes occur. If you have any problems or questions after discharge, please call your caregiver. °HOME INSTRUCTIONS °You may resume a normal diet and activities as directed. Take showers instead of baths until informed otherwise.  °Change bandages (dressings) in 3 days.  Swab wounds daily with betadine.  Wash shoulder with soap and water.  Pat dry.  Cover wounds with bandaids. °Only take over-the-counter or prescription medicines for pain, discomfort, or fever as directed by your caregiver.  °Wear your sling for the next 2 days unless otherwise instructed. °Eat a well-balanced diet.  °Avoid lifting or driving until you are instructed otherwise.  °Make an appointment to see your caregiver for stitches (suture) or staple removal one week after surgery. ° °SEEK MEDICAL CARE IF: °You have swelling of your calf or leg.  °You develop shortness of breath or chest pain.  °You have redness, swelling, or increasing pain in the wound.  °There is pus or any unusual drainage coming from the surgical site.  °You notice a bad smell coming from the surgical site or dressing.  °The surgical site breaks open after sutures or staples have been removed.  °There is persistent bleeding from the suture or staple line.  °You are getting worse or are not improving.  °You have any other questions or concerns.  °SEEK IMMEDIATE MEDICAL CARE IF:  °You have a fever greater than 101 °You develop a rash.  °You have difficulty breathing.  °You develop any reaction or side effects to medicines given.    °Your knee motion is decreasing rather than improving.  °MAKE SURE YOU:  °Understand these instructions.  °Will watch your condition.  °Will get help right away if you are not doing well or get worse.  ° °Post Anesthesia Home Care Instructions ° °Activity: °Get plenty of rest for the remainder of the day. A responsible adult should stay with you for 24 hours following the procedure.  °For the next 24 hours, DO NOT: °-Drive a car °-Operate machinery °-Drink alcoholic beverages °-Take any medication unless instructed by your physician °-Make any legal decisions or sign important papers. ° °Meals: °Start with liquid foods such as gelatin or soup. Progress to regular foods as tolerated. Avoid greasy, spicy, heavy foods. If nausea and/or vomiting occur, drink only clear liquids until the nausea and/or vomiting subsides. Call your physician if vomiting continues. ° °Special Instructions/Symptoms: °Your throat may feel dry or sore from the anesthesia or the breathing tube placed in your throat during surgery. If this causes discomfort, gargle with warm salt water. The discomfort should disappear within 24 hours. ° °If you had a scopolamine patch placed behind your ear for the management of post- operative nausea and/or vomiting: ° °1. The medication in the patch is effective for 72 hours, after which it should be removed.  Wrap patch in a tissue and discard in the trash. Wash hands thoroughly with soap and water. °2. You may remove the patch earlier than 72 hours if you experience unpleasant side effects which may include dry mouth, dizziness or visual disturbances. °3. Avoid touching the patch. Wash your hands with soap and water after contact with   the patch. ° ° °  °Regional Anesthesia Blocks ° °1. Numbness or the inability to move the "blocked" extremity may last from 3-48 hours after placement. The length of time depends on the medication injected and your individual response to the medication. If the numbness is not  going away after 48 hours, call your surgeon. ° °2. The extremity that is blocked will need to be protected until the numbness is gone and the  Strength has returned. Because you cannot feel it, you will need to take extra care to avoid injury. Because it may be weak, you may have difficulty moving it or using it. You may not know what position it is in without looking at it while the block is in effect. ° °3. For blocks in the legs and feet, returning to weight bearing and walking needs to be done carefully. You will need to wait until the numbness is entirely gone and the strength has returned. You should be able to move your leg and foot normally before you try and bear weight or walk. You will need someone to be with you when you first try to ensure you do not fall and possibly risk injury. ° °4. Bruising and tenderness at the needle site are common side effects and will resolve in a few days. ° °5. Persistent numbness or new problems with movement should be communicated to the surgeon or the [facility] ([phone])/ [facility] ([phone]). °

## 2015-09-11 ENCOUNTER — Encounter (HOSPITAL_BASED_OUTPATIENT_CLINIC_OR_DEPARTMENT_OTHER): Payer: Self-pay | Admitting: Orthopedic Surgery

## 2015-09-11 NOTE — Op Note (Signed)
[name], [name]               ACCOUNT NO.:  [id number]  MEDICAL RECORD NO.:  [id number]  LOCATION:                               FACILITY:  [facility]  PHYSICIAN:  [name]. Mckynzi Cammon, M.D. 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  Abrasive tearing on the top of the cuff.  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  Abrasive tearing on the top of the cuff with circumferential degenerative tearing of labrum without specific instability pattern.  Marked, reactive adhesive bursitis.  PROCEDURES:  Right shoulder exam under anesthesia, arthroscopy. Extensive debridement of labrum.  Bursectomy, acromioplasty, coracoacromial ligament release.  Debridement of superior rotator cuff. Excision of distal clavicle.  SURGEON:  [name]. Shataria Crist, M.D.  ASSISTANT:  Mikey KirschnerLindsey Stanberry, PA, present throughout the entire case and necessary for timely completion of procedure.  ANESTHESIA:  General.  BLOOD LOSS:  Minimal.  SPECIMENS:  None.  CULTURES:  None.  COMPLICATIONS:  None.  DRESSINGS:  Soft compressive with sling.  DESCRIPTION OF PROCEDURE:  The patient was brought to the operating room and placed on the operating table in supine position.  After adequate anesthesia had been obtained, placed in a beach-chair position. Shoulder positioner, prepped and draped in usual sterile fashion.  Three portals; anterior, posterior and lateral.  Arthroscope was introduced. Shoulder was distended and inspected.  Marked circumferential tearing of labrum.  Debrided back to a stable surface.  No real instability pattern.   After debridement of the labrum, biceps tendon, biceps anchor still intact.  Articular cartilage looked good.  Undersurface of the rotator cuff normal.  Cannula redirected subacromially.  Type 2-3 acromion.  Marked reactive bursitis.  Abrasive changes on the top of the cuff.  Bursa resected, cuff debrided.  Acromioplasty to a type 1 acromion, releasing CA ligament.  Marked spurring, grade 4 changes to [facility] joint.  Periarticular spurs, lateral centimeter of clavicle resected. Adequacy of decompression confirmed viewing from all portals.  Instruments and fluid were removed.  Portals were closed with nylon.  Sterile compressive dressing applied.  Sling applied.  Anesthesia reversed.  Brought to the recovery room.  Tolerated the surgery well.  No complications.     [name]. Denetta Fei, M.D.     DFM/MEDQ  D:  09/10/2015  T:  09/11/2015  Job:  [id number]

## 2017-05-05 ENCOUNTER — Encounter (HOSPITAL_COMMUNITY): Payer: Self-pay | Admitting: Emergency Medicine

## 2017-05-05 ENCOUNTER — Ambulatory Visit (HOSPITAL_COMMUNITY)
Admission: EM | Admit: 2017-05-05 | Discharge: 2017-05-05 | Disposition: A | Discharge status: Home / Self Care | Payer: 59 | Attending: Internal Medicine | Admitting: Internal Medicine

## 2017-05-05 DIAGNOSIS — R042 Hemoptysis: Secondary | ICD-10-CM | POA: Diagnosis not present

## 2017-05-05 DIAGNOSIS — R1013 Epigastric pain: Secondary | ICD-10-CM

## 2017-05-05 DIAGNOSIS — R0789 Other chest pain: Secondary | ICD-10-CM | POA: Diagnosis not present

## 2017-05-05 DIAGNOSIS — R1031 Right lower quadrant pain: Secondary | ICD-10-CM | POA: Diagnosis not present

## 2017-05-05 LAB — POCT I-STAT, CHEM 8
BUN: 12 mg/dL (ref 6–20)
CREATININE: 1.2 mg/dL (ref 0.61–1.24)
Calcium, Ion: 1.16 mmol/L (ref 1.15–1.40)
Chloride: 104 mmol/L (ref 101–111)
GLUCOSE: 110 mg/dL — AB (ref 65–99)
HCT: 48 % (ref 39.0–52.0)
HEMOGLOBIN: 16.3 g/dL (ref 13.0–17.0)
Potassium: 4.1 mmol/L (ref 3.5–5.1)
Sodium: 141 mmol/L (ref 135–145)
TCO2: 27 mmol/L (ref 0–100)

## 2017-05-05 LAB — POCT H PYLORI SCREEN: H. PYLORI SCREEN, POC: NEGATIVE

## 2017-05-05 MED ORDER — RANITIDINE HCL 150 MG PO TABS: 150.0000 mg | ORAL_TABLET | Freq: Two times a day (BID) | ORAL | 0 refills | Status: DC

## 2017-05-05 NOTE — ED Triage Notes (Signed)
[facility] a [facility] of chest pain x 2 weeks and he also reported that this am he had an episode of epigastric pain and vomited blood 1 time.

## 2017-05-05 NOTE — ED Provider Notes (Addendum)
MRN: [id number] DOB: 1970-12-26  Subjective:   Jeremiah Williams is a 46 y.o. male presenting for chief complaint of Chest Pain and Hemoptysis  Reports 2 week history of intermittent chest pain. Pain is over mid chest and epigastric area, can radiate toward his back. Today, patient became very concerned that he had nausea with vomiting with slight amount of blood in it. He admits feeling sweaty today after having nausea and vomiting. Denies fever, shob, heart racing, palpitations. Denies history of H. Pylori infection. Denies smoking cigarettes. Denies history of HTN, heart disease. Has had a full cardiac work up for epigastric and chest pain in 2012, which turned out to be normal per patient. These symptoms resolved after having elective cholecystectomy. He has not had recurrence of symptoms until the past 2 weeks.   Also has No Known Allergies.  Jeremiah Williams  has a past medical history of Osteoarthritis. Also  has a past surgical history that includes Cholecystectomy; Hernia repair (Bilateral); and Shoulder arthroscopy with distal clavicle resection (Right, 09/10/2015).  Objective:   Vitals: BP (!) 153/108 (BP Location: Right Arm)   Pulse 63   Temp 98.4 F (36.9 C) (Oral)   Resp 20   SpO2 98%   Physical Exam  Constitutional: He is oriented to person, place, and time. He appears well-developed and well-nourished.  HENT:  Mouth/Throat: Oropharynx is clear and moist.  Cardiovascular: Normal rate, regular rhythm and intact distal pulses.  Exam reveals no gallop and no friction rub.   No murmur heard. Pulmonary/Chest: No respiratory distress. He has no wheezes. He has no rales. He exhibits no tenderness.  Abdominal: Soft. Bowel sounds are normal. He exhibits no distension and no mass. There is tenderness (mild, RLQ). There is no guarding.  Neurological: He is alert and oriented to person, place, and time.  Skin: Skin is warm and dry.   ED ECG REPORT   Date: 05/05/2017  Rate: 65  Rhythm:  normal sinus rhythm  QRS Axis: normal  Intervals: normal  ST/T Wave abnormalities: non-specific t-wave flattening in Lead III  Conduction Disutrbances:none  Narrative Interpretation:   Old EKG Reviewed: none available  I have personally reviewed the EKG tracing and agree with the computerized printout as noted.  Results for orders placed or performed during the hospital encounter of 05/05/17 (from the past 24 hour(s))  I-STAT, chem 8     Status: Abnormal   Collection Time: 05/05/17  2:31 PM  Result Value Ref Range   Sodium 141 135 - 145 mmol/L   Potassium 4.1 3.5 - 5.1 mmol/L   Chloride 104 101 - 111 mmol/L   BUN 12 6 - 20 mg/dL   Creatinine, Ser 4.091.20 0.61 - 1.24 mg/dL   Glucose, Bld 811110 (H) 65 - 99 mg/dL   Calcium, Ion 9.141.16 7.821.15 - 1.40 mmol/L   TCO2 27 0 - 100 mmol/L   Hemoglobin 16.3 13.0 - 17.0 g/dL   HCT 95.648.0 21.339.0 - 08.652.0 %  H.pylori screen, POC     Status: None   Collection Time: 05/05/17  2:35 PM  Result Value Ref Range   H. PYLORI SCREEN, POC NEGATIVE NEGATIVE    Assessment and Plan :   1. Atypical chest pain   2. Abdominal pain, epigastric    ECG, POC labs, physical exam findings reassuring. Will manage as reflux type symptoms. Patient will try Zantac, counseled on diet changes. Return-to-clinic precautions discussed, patient verbalized understanding.   Jeremiah Williams, Jeremiah Ferrell, PA-C 05/05/17 Jeremiah Williams    Jeremiah Williams,  Jeremiah Williams, [place] 05/05/17 Jeremiah Williams

## 2017-06-02 ENCOUNTER — Other Ambulatory Visit: Payer: Self-pay | Admitting: Urgent Care

## 2017-06-12 NOTE — Telephone Encounter (Signed)
This patient is not known to me. They need an OV with their PCP.

## 2017-07-10 ENCOUNTER — Telehealth: Payer: Self-pay | Admitting: *Deleted

## 2017-07-10 NOTE — Telephone Encounter (Signed)
Pt states his wife, Roanna Raider works with [employer], and he has a broken toenail he is tired of clipping, and wants to know the process for getting one removed permanently. I described the process, and after care to pt and transferred to schedulers.

## 2017-07-17 ENCOUNTER — Other Ambulatory Visit (INDEPENDENT_AMBULATORY_CARE_PROVIDER_SITE_OTHER): Payer: 59

## 2017-07-17 ENCOUNTER — Ambulatory Visit (INDEPENDENT_AMBULATORY_CARE_PROVIDER_SITE_OTHER): Payer: 59 | Admitting: Internal Medicine

## 2017-07-17 ENCOUNTER — Ambulatory Visit (INDEPENDENT_AMBULATORY_CARE_PROVIDER_SITE_OTHER)
Admission: RE | Admit: 2017-07-17 | Discharge: 2017-07-17 | Disposition: A | Discharge status: Home / Self Care | Payer: 59 | Source: Ambulatory Visit | Attending: Internal Medicine | Admitting: Internal Medicine

## 2017-07-17 ENCOUNTER — Encounter: Payer: Self-pay | Admitting: Internal Medicine

## 2017-07-17 VITALS — BP 132/72 | HR 74 | Ht 64.0 in | Wt 239.0 lb

## 2017-07-17 DIAGNOSIS — J45991 Cough variant asthma: Secondary | ICD-10-CM

## 2017-07-17 LAB — CBC WITH DIFFERENTIAL/PLATELET
BASOS PCT: 0.8 % (ref 0.0–3.0)
Basophils Absolute: 0 10*3/uL (ref 0.0–0.1)
EOS ABS: 0.2 10*3/uL (ref 0.0–0.7)
EOS PCT: 2.9 % (ref 0.0–5.0)
HCT: 46.7 % (ref 39.0–52.0)
Hemoglobin: 16.1 g/dL (ref 13.0–17.0)
Lymphocytes Relative: 28.3 % (ref 12.0–46.0)
Lymphs Abs: 1.6 10*3/uL (ref 0.7–4.0)
MCHC: 34.5 g/dL (ref 30.0–36.0)
MCV: 94.8 fl (ref 78.0–100.0)
MONO ABS: 0.6 10*3/uL (ref 0.1–1.0)
Monocytes Relative: 11.3 % (ref 3.0–12.0)
NEUTROS ABS: 3.2 10*3/uL (ref 1.4–7.7)
Neutrophils Relative %: 56.7 % (ref 43.0–77.0)
PLATELETS: 266 10*3/uL (ref 150.0–400.0)
RBC: 4.93 Mil/uL (ref 4.22–5.81)
RDW: 13.4 % (ref 11.5–15.5)
WBC: 5.7 10*3/uL (ref 4.0–10.5)

## 2017-07-17 LAB — NITRIC OXIDE: Nitric Oxide: 30

## 2017-07-17 MED ORDER — PANTOPRAZOLE SODIUM 40 MG PO TBEC: 40.0000 mg | DELAYED_RELEASE_TABLET | Freq: Every day | ORAL | 2 refills | Status: DC

## 2017-07-17 MED ORDER — FAMOTIDINE 20 MG PO TABS: ORAL_TABLET | ORAL | 2 refills | Status: DC

## 2017-07-17 MED ORDER — PREDNISONE 10 MG PO TABS: ORAL_TABLET | ORAL | 0 refills | Status: DC

## 2017-07-17 MED ORDER — TRAMADOL HCL 50 MG PO TABS: ORAL_TABLET | ORAL | 0 refills | Status: DC

## 2017-07-17 NOTE — Progress Notes (Signed)
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 Pulmonary Consult    Self referral.  Pt c/o cough and wheezing x 2 months. He states his cough is non prod. He gets SOB when he starts coughing. Cough is worse after exertion.    onset was acute in Aug 2018 s assoc uri/ rhinitis symptoms.  Pattern is day >> noct with temperature change appears to trigger the cough - eg walking in cold air then better when back in doors Also w/in 15 min of stopping ex then burns outside   sleeps fine though some cough at hs  Has son's inhaler = saba and no better  Has coughed to point of vomiting    No obvious  day to day or daytime variability or assoc excess/ purulent sputum or mucus plugs or hemoptysis or cp or chest tightness, subjective wheeze or overt sinus or hb symptoms. No unusual exp hx or h/o childhood pna/ asthma or knowledge of premature birth.  Sleeping ok flat without nocturnal  or early am exacerbation  of respiratory  c/o's or need for noct saba. Also denies any obvious fluctuation of symptoms with weather or environmental changes or other aggravating or alleviating factors except as outlined above   Current Allergies, Complete Past Medical History, Past Surgical History, Family History, and Social History were reviewed in [facility] electronic medical record.  ROS  The following are not active complaints unless bolded Hoarseness, sore throat,  dysphagia, dental problems, itching, sneezing,  nasal congestion or discharge of excess mucus or purulent secretions, ear ache,   fever, chills, sweats, unintended wt loss or wt gain, classically pleuritic or exertional cp,  orthopnea pnd or leg swelling, presyncope, palpitations, abdominal pain, anorexia, nausea, vomiting, diarrhea  or change in bowel habits or change in bladder habits, change in stools or change in urine, dysuria, hematuria,  rash, arthralgias, visual complaints, headache, numbness, weakness or ataxia or problems with walking or coordination,  change in mood/affect or memory.        No outpatient prescriptions have been marked as taking for the 07/17/17 encounter (Office Visit) with Nyoka CowdenWert, Kriston Pasquarello B, MD.           Review of Systems     Objective:   Physical Exam    amb wm nad until starts coughing fits  Wt Readings from Last 3 Encounters:  07/17/17 239 lb (108.4 kg)  09/10/15 229 lb (103.9 kg)  10/07/13 210 lb (95.3 kg)    Vital signs reviewed  - Note on arrival 02 sats  97% on RA       HEENT: nl dentition, turbinates bilaterally, and oropharynx. Nl external ear canals without cough reflex   NECK :  without JVD/Nodes/TM/ nl carotid upstrokes bilaterally   LUNGS: no acc muscle use,  Nl contour chest which is clear to A and P bilaterally  with cough on insp and exp    CV:  RRR  no s3 or murmur or increase in P2, and no edema   ABD:  soft and nontender with nl inspiratory excursion in the supine position. No bruits or organomegaly appreciated, bowel sounds nl  MS:  Nl gait/ ext warm without deformities, calf tenderness, cyanosis or clubbing No obvious joint restrictions   SKIN: warm and dry without lesions    NEURO:  alert, approp, nl sensorium with  no motor or cerebellar deficits apparent.    CXR PA and Lateral:   07/17/2017 :    I personally reviewed images and agree with radiology impression as follows:   No radiographic evidence of acute  cardiopulmonary disease.     Labs ordered 07/17/2017   Allergy profile      Assessment:

## 2017-07-17 NOTE — Patient Instructions (Addendum)
The key to effective treatment for your cough is eliminating the non-stop cycle of cough you're stuck in long enough to let your airway heal completely and then see if there is anything still making you cough once you stop the cough suppression, but this should take no more than 5 days to figure out  First take delsym two tsp every 12 hours and supplement if needed with  tramadol 50 mg up to 2 every 4 hours to suppress the urge to cough at all or even clear your throat. Swallowing water or using ice chips/non mint and menthol containing candies (such as lifesavers or sugarless jolly ranchers) are also effective.  You should rest your voice and avoid activities that you know make you cough.  Once you have eliminated the cough for 3 straight days try reducing the tramadol first,  then the delsym as tolerated.    Prednisone 10 mg take  4 each am x 2 days,   2 each am x 2 days,  1 each am x 2 days and stop (this is to eliminate allergies and inflammation from coughing)  Protonix (pantoprazole) Take 30-60 min before first meal of the day and Pepcid 20 mg one bedtime plus chlorpheniramine 4 mg x 2 at bedtime (both available over the counter)  until cough is completely gone for at least a week without the need for cough suppression  GERD (REFLUX)  is an extremely common cause of respiratory symptoms, many times with no significant heartburn at all.    It can be treated with medication, but also with lifestyle changes including avoidance of late meals, excessive alcohol, smoking cessation, and avoid fatty foods, chocolate, peppermint, colas, red wine, and acidic juices such as orange juice.  NO MINT OR MENTHOL PRODUCTS SO NO COUGH DROPS   USE HARD CANDY INSTEAD (jolley ranchers or Stover's or Lifesavers (all available in sugarless versions) NO OIL BASED VITAMINS - use powdered substitutes.  Please remember to go to the lab and x-ray department downstairs in the basement  for your tests - we will call you  with the results when they are available.     If not better return in 2 weeks

## 2017-07-18 LAB — RESPIRATORY ALLERGY PROFILE REGION II ~~LOC~~
ALLERGEN, D PTERNOYSSINUS, D1: 0.11 kU/L — AB
ALLERGEN, MULBERRY, T70: 1.54 kU/L — AB
Allergen, A. alternata, m6: <0.1 kU/L
Allergen, Cedar tree, t12: 1.64 kU/L — ABNORMAL HIGH
Allergen, Comm Silver Birch, t9: 1.6 kU/L — ABNORMAL HIGH
Allergen, Cottonwood, t14: 2.03 kU/L — ABNORMAL HIGH
Allergen, Oak,t7: 1.99 kU/L — ABNORMAL HIGH
BOX ELDER: 2.19 kU/L — AB
Bermuda Grass: 12.9 kU/L — ABNORMAL HIGH
CLADOSPORIUM HERBARUM (M2) IGE: <0.1 kU/L
CLASS: 0
CLASS: 0
CLASS: 0
CLASS: 0
CLASS: 2
CLASS: 2
CLASS: 2
CLASS: 2
CLASS: 3
CLASS: 4
COMMON RAGWEED (SHORT) (W1) IGE: 2.15 kU/L — ABNORMAL HIGH
Cat Dander: <0.1 kU/L
Class: 0
Class: 0
Class: 0
Class: 0
Class: 0
Class: 2
Class: 2
Class: 2
Class: 2
Class: 2
Class: 2
Class: 2
Class: 2
Class: 3
Cockroach: 1.28 kU/L — ABNORMAL HIGH
D. FARINAE: 0.29 kU/L — AB
Dog Dander: 0.15 kU/L — ABNORMAL HIGH
Elm IgE: 2.14 kU/L — ABNORMAL HIGH
IgE (Immunoglobulin E), Serum: 232 kU/L — ABNORMAL HIGH (ref <=114)
Johnson Grass: 10.8 kU/L — ABNORMAL HIGH
Pecan/Hickory Tree IgE: 1.85 kU/L — ABNORMAL HIGH
ROUGH PIGWEED IGE: 1.83 kU/L — AB
Sheep Sorrel IgE: 1.97 kU/L — ABNORMAL HIGH
TIMOTHY GRASS: 18.2 kU/L — AB

## 2017-07-18 LAB — INTERPRETATION:

## 2017-07-18 NOTE — Assessment & Plan Note (Signed)
FENO 07/17/2017  =   30  - Allergy profile 07/17/2017 >  Eos 0.2 /  IgE pending  The most common causes of chronic cough in immunocompetent adults include the following: upper airway cough syndrome (UACS), previously referred to as postnasal drip syndrome (PNDS), which is caused by variety of rhinosinus conditions; (2) asthma; (3) GERD; (4) chronic bronchitis from cigarette smoking or other inhaled environmental irritants; (5) nonasthmatic eosinophilic bronchitis; and (6) bronchiectasis.   These conditions, singly or in combination, have accounted for up to 94% of the causes of chronic cough in prospective studies.   Other conditions have constituted no >6% of the causes in prospective studies These have included bronchogenic carcinoma, chronic interstitial pneumonia, sarcoidosis, left ventricular failure, ACEI-induced cough, and aspiration from a condition associated with pharyngeal dysfunction.    Chronic cough is often simultaneously caused by more than one condition. A single cause has been found from 38 to 82% of the time, multiple causes from 18 to 62%. Multiply caused cough has been the result of three diseases up to 42% of the time.   The feno is intermediate but suggestive of cough variant asthma but the features now are more c/w UACS  Of the three most common causes of  Sub-acute or recurrent or chronic cough, only one (GERD)  can actually contribute to/ trigger  the other two (asthma and post nasal drip syndrome)  and perpetuate the cylce of cough.  While not intuitively obvious, many patients with chronic low grade reflux do not cough until there is a primary insult that disturbs the protective epithelial barrier and exposes sensitive nerve endings.   This is typically viral but can be direct physical injury such as with an endotracheal tube.   The point is that once this occurs, it is difficult to eliminate the cycle  using anything but a maximally effective acid suppression regimen at  least in the short run, accompanied by an appropriate diet to address non acid GERD and control / eliminate the cough itself for at least 3 days.   rec  Max rx for gerd short term and eliminate cyclical cough with tramadol eval for allergy  F/u prn - next option would be trial of gabapentin if not responding to above rx   Total time devoted to counseling  > 50 % of initial 60 min office visit:  review case with pt/ discussion of options/alternatives/ personally creating written customized instructions  in presence of pt  then going over those specific  Instructions directly with the pt including how to use all of the meds but in particular covering each new medication in detail and the difference between the maintenance= "automatic" meds and the prns using an action plan format for the latter (If this problem/symptom => do that organization reading Left to right).  Please see AVS from this visit for a full list of these instructions which I personally wrote for this pt and  are unique to this visit.

## 2017-07-18 NOTE — Progress Notes (Signed)
Left a detailed msg with results

## 2017-07-19 ENCOUNTER — Telehealth: Payer: Self-pay | Admitting: Internal Medicine

## 2017-07-19 NOTE — Telephone Encounter (Signed)
Patient called back - states that he is returning call about labs - he has already received info on cxr - he can be reached at [phone] only -pr

## 2017-07-19 NOTE — Progress Notes (Signed)
See 10.24.18 phone note

## 2017-07-19 NOTE — Telephone Encounter (Signed)
ATC pt on his work number x2 - line busy both times LMOM TCB x1 on cell number - unable to leave detailed message because there is no dpr on file.  Did let pt know that if we are unable to speak with him when he calls back to let whomever helps him know if we can leave a detailed message and we'll be happy to do this.  10.22.18 lab results per MW: Notes recorded by Nyoka CowdenWert, Michael B, MD on 07/19/2017 at 5:36 AM EDT Call patient :  Studies are c/w moderate allergies to everything but cats and mold  Be sure patient has f/u ov so we can go over all the details of this study and get a plan together moving forward - ok to move up f/u if not feeling better and wants to be seen sooner

## 2017-07-19 NOTE — Telephone Encounter (Signed)
Notes recorded by Nyoka CowdenWert, Michael B, MD on 07/18/2017 at 5:45 AM EDT Call pt: Reviewed cxr and no acute change so [facility]  [facility] pt, no answer. Left message for pt to call back.

## 2017-07-19 NOTE — Telephone Encounter (Signed)
Spoke with patient. He is aware of results. 2 week follow up has been scheduled for 08/04/17 at 145. Patient verbalized understanding. Nothing else needed at time of call.

## 2017-08-04 ENCOUNTER — Ambulatory Visit: Payer: 59 | Admitting: Internal Medicine

## 2017-08-10 ENCOUNTER — Ambulatory Visit: Payer: Self-pay | Admitting: Podiatry

## 2017-09-15 ENCOUNTER — Ambulatory Visit: Payer: Self-pay | Admitting: Podiatry

## 2017-10-30 ENCOUNTER — Encounter: Payer: Self-pay | Admitting: Internal Medicine

## 2017-10-30 ENCOUNTER — Ambulatory Visit: Payer: 59 | Admitting: Internal Medicine

## 2017-10-30 VITALS — BP 128/88 | HR 86 | Ht 64.0 in | Wt 247.0 lb

## 2017-10-30 DIAGNOSIS — J45991 Cough variant asthma: Secondary | ICD-10-CM

## 2017-10-30 MED ORDER — FAMOTIDINE 20 MG PO TABS: ORAL_TABLET | ORAL | 2 refills | Status: DC

## 2017-10-30 MED ORDER — PREDNISONE 10 MG PO TABS: ORAL_TABLET | ORAL | 0 refills | Status: DC

## 2017-10-30 MED ORDER — TRAMADOL HCL 50 MG PO TABS: ORAL_TABLET | ORAL | 0 refills | Status: DC

## 2017-10-30 MED ORDER — PANTOPRAZOLE SODIUM 40 MG PO TBEC: 40.0000 mg | DELAYED_RELEASE_TABLET | Freq: Every day | ORAL | 2 refills | Status: DC

## 2017-10-30 NOTE — Assessment & Plan Note (Signed)
FENO 07/17/2017  =   30  - Allergy profile 07/17/2017 >  Eos 0.2 /  IgE  232 with pos RAST for everything but cats and mold - cyclical cough rx 07/17/17 > resolved until end of Jan 2019 with resumption of outdoor biking off gerd rx - 10/30/2017 rec cyclical cough rx plus maint on gerd rx until does MCT    Strongly suspect he's got a chronic problem that flares with exercise or cold air exposure so may need maint rx at this point  However, Of the three most common causes of  Sub-acute or recurrent or chronic cough, only one (GERD)  can actually contribute to/ trigger  the other two (asthma and post nasal drip syndrome)  and perpetuate the cylce of cough.  While not intuitively obvious, many patients with chronic low grade reflux do not cough until there is a primary insult that disturbs the protective epithelial barrier and exposes sensitive nerve endings.   This is typically viral but can be direct physical injury such as with an endotracheal tube.   The point is that once this occurs, it is difficult to eliminate the cycle  using anything but a maximally effective acid suppression regimen at least in the short run, accompanied by an appropriate diet to address non acid GERD and control / eliminate the cough itself for at least 3 days.   Next step is therefore repeat the cyclcal cough rx and then do MCT while still on gerd rx  I had an extended discussion with the patient reviewing all relevant studies completed to date and  lasting 15 to 20 minutes of a 25 minute acute  visit    Each maintenance medication was reviewed in detail including most importantly the difference between maintenance and prns and under what circumstances the prns are to be triggered using an action plan format that is not reflected in the computer generated alphabetically organized AVS.    Please see AVS for specific instructions unique to this visit that I personally wrote and verbalized to the the pt in detail and then  reviewed with pt  by my nurse highlighting any  changes in therapy recommended at today's visit to their plan of care.

## 2017-10-30 NOTE — Progress Notes (Signed)
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 Pulmonary Consult    Self referral.  Pt c/o cough and wheezing x 2 months. He states his cough is non prod. He gets SOB when he starts coughing. Cough is worse after exertion.    onset was acute in Aug 2018 s assoc uri/ rhinitis symptoms.  Pattern is day >> noct with temperature change appears to trigger the cough - eg walking in cold air then better when back in doors Also w/in 15 min of stopping ex then burns outside   sleeps fine though some cough at hs  Has son's inhaler = saba and no better  Has coughed to point of vomiting    No obvious  day to day or daytime variability or assoc excess/ purulent sputum or mucus plugs or hemoptysis or cp or chest tightness, subjective wheeze or overt sinus or hb symptoms. No unusual exp hx or h/o childhood pna/ asthma or knowledge of premature birth.  Sleeping ok flat without nocturnal  or early am exacerbation  of respiratory  c/o's or need for noct saba. Also denies any obvious fluctuation of symptoms with weather or environmental changes or other aggravating or alleviating factors except as outlined above   Current Allergies, Complete Past Medical History, Past Surgical History, Family History, and Social History were reviewed in [facility] electronic medical record.  ROS  The following are not active complaints unless bolded Hoarseness, sore throat,  dysphagia, dental problems, itching, sneezing,  nasal congestion or discharge of excess mucus or purulent secretions, ear ache,   fever, chills, sweats, unintended wt loss or wt gain, classically pleuritic or exertional cp,  orthopnea pnd or leg swelling, presyncope, palpitations, abdominal pain, anorexia, nausea, vomiting, diarrhea  or change in bowel habits or change in bladder habits, change in stools or change in urine, dysuria, hematuria,  rash, arthralgias, visual complaints, headache, numbness, weakness or ataxia or problems with walking or coordination,  change in mood/affect or memory.        No outpatient prescriptions have been marked as taking for the 07/17/17 encounter (Office Visit) with Jeremiah Williams, Jeremiah Sarvis B, MD.           Review of Systems     Objective:   Physical Exam    amb wm nad until starts coughing fits  Wt Readings from Last 3 Encounters:  07/17/17 239 lb (108.4 kg)  09/10/15 229 lb (103.9 kg)  10/07/13 210 lb (95.3 kg)    Vital signs reviewed  - Note on arrival 02 sats  97% on RA       HEENT: nl dentition, turbinates bilaterally, and oropharynx. Nl external ear canals without cough reflex   NECK :  without JVD/Nodes/TM/ nl carotid upstrokes bilaterally   LUNGS: no acc muscle use,  Nl contour chest which is clear to A and P bilaterally  with cough on insp and exp    CV:  RRR  no s3 or murmur or increase in P2, and no edema   ABD:  soft and nontender with nl inspiratory excursion in the supine position. No bruits or organomegaly appreciated, bowel sounds nl  MS:  Nl gait/ ext warm without deformities, calf tenderness, cyanosis or clubbing No obvious joint restrictions   SKIN: warm and dry without lesions    NEURO:  alert, approp, nl sensorium with  no motor or cerebellar deficits apparent.    CXR PA and Lateral:   07/17/2017 :    I personally reviewed images and agree with radiology impression as follows:   No radiographic evidence of acute  cardiopulmonary disease.     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 Pulmonary Consult    Self referral.  Pt c/o cough and wheezing x 2 months. He states his cough is non prod. He gets SOB when he starts coughing. Cough is worse after exertion.    onset was acute in Aug 2018 s assoc uri/ rhinitis symptoms.  Pattern is day >> noct with temperature change appears to trigger the cough - eg walking in cold air then better when back in doors Also w/in 15 min of stopping ex  sleeps fine though some cough at hs  Has son's inhaler = saba and no better  Has coughed to point of vomiting  Rec  First take delsym two tsp every 12 hours and supplement if needed with  tramadol 50 mg up to 2 every 4 hours to suppress the urge to cough at all or even clear your throat. Swallowing water or using ice chips/non mint and menthol containing candies (such as lifesavers or sugarless jolly ranchers) are also effective.  You should rest your voice and avoid activities that you know make you cough. Once you have eliminated the cough for 3 straight days try reducing the tramadol first,  then the delsym as tolerated.   Prednisone 10 mg take  4 each am x 2 days,   2 each am x 2 days,  1 each am x 2 days and stop (this is to eliminate allergies  and inflammation from coughing) Protonix (pantoprazole) Take 30-60 min before first meal of the day and Pepcid 20 mg one bedtime plus chlorpheniramine 4 mg x 2 at bedtime (both available over the counter)  until cough is completely gone for at least a week without the need for cough suppression GERD  Please remember to go to the lab and x-ray department downstairs in the basement  for your tests - we will call you with the results when they are available.  10/30/2017  Acute extended ov/Calahan Pak re: recurrent cough ? EIA/ cough variant asthma  Chief Complaint  Patient presents with  . Acute Visit    Cough had resolved and then returned over the past wk. Cough is non prod.    was fine p last ov and stopped all medsbut also stopped outdoor biking  then one week prior to OV  Biked out doors and started coughing again correlated with stopping exercise and ever since coughing non-stop daytime, worse with temp changes / never productive Still coughing same at hs/ not waking up with it Not limited by breathing from desired activities  As long as not coughing   No obvious other patterns in day to day or daytime variability or assoc excess/ purulent sputum or mucus plugs or hemoptysis or cp or chest tightness, subjective wheeze or overt sinus or hb symptoms. No unusual exposure hx or h/o childhood pna/ asthma or knowledge of premature birth.  Sleeping ok flat without nocturnal  or early am exacerbation  of respiratory  c/o's or need for noct saba. Also denies any obvious fluctuation of symptoms with weather or environmental changes or other aggravating or alleviating factors except as outlined above   Current Allergies, Complete Past Medical History, Past Surgical History, Family History, and Social History were reviewed in [facility] record.  ROS  The following are not active complaints unless bolded Hoarseness, sore throat, dysphagia, dental problems, itching, sneezing,  nasal  congestion or discharge of excess mucus or purulent secretions, ear ache,   fever, chills, sweats, unintended wt loss or wt gain, classically pleuritic or exertional cp,  orthopnea pnd or leg swelling, presyncope, palpitations, abdominal pain, anorexia, nausea, vomiting, diarrhea  or change in bowel habits or change in bladder habits, change in stools or change in urine, dysuria, hematuria,  rash, arthralgias, visual complaints, headache, numbness, weakness or ataxia or problems with walking or coordination,  change in mood/affect or memory.        No outpatient medications have been marked as taking for the 10/30/17 encounter (Office Visit) with Jeremiah Cowden, MD.                            Objective:   Physical Exam   amb wm nad   10/30/2017          247   07/17/17 239 lb (108.4 kg)  09/10/15 229 lb (103.9 kg)  10/07/13 210 lb (95.3 kg)    Vital signs reviewed - Note on arrival 02 sats  94% on  RA      HEENT: nl dentition, turbinates bilaterally, and oropharynx. Nl external ear canals without cough reflex   NECK :  without JVD/Nodes/TM/ nl carotid upstrokes bilaterally   LUNGS: no acc muscle use,  Nl contour chest which is clear to A and P bilaterally with urge to cough on exp > insp    CV:  RRR  no s3 or murmur or increase in P2, and no edema   ABD:  soft and nontender with nl inspiratory excursion in the supine position. No bruits or organomegaly appreciated, bowel sounds nl  MS:  Nl gait/ ext warm without deformities, calf tenderness, cyanosis or clubbing No obvious joint restrictions   SKIN: warm and dry without lesions    NEURO:  alert, approp, nl sensorium with  no motor or cerebellar deficits apparent.          Assessment:

## 2017-10-30 NOTE — Patient Instructions (Addendum)
The key to effective treatment for your cough is eliminating the non-stop cycle of cough you're stuck in long enough to let your airway heal completely and then see if there is anything still making you cough once you stop the cough suppression, but this should take no more than 5 days to figure out  First take delsym two tsp every 12 hours and supplement if needed with  tramadol 50 mg up to 2 every 4 hours to suppress the urge to cough at all or even clear your throat. Swallowing water or using ice chips/non mint and menthol containing candies (such as lifesavers or sugarless jolly ranchers) are also effective.  You should rest your voice and avoid activities that you know make you cough.  Once you have eliminated the cough for 3 straight days try reducing the tramadol first,  then the delsym as tolerated.    Prednisone 10 mg take  4 each am x 2 days,   2 each am x 2 days,  1 each am x 2 days and stop (this is to eliminate allergies and inflammation from coughing)  Protonix (pantoprazole) Take 30-60 min before first meal of the day and Pepcid 20 mg one bedtime plus chlorpheniramine 4 mg x 2 at bedtime (both available over the counter)  until cough is completely gone for at least a week without the need for cough suppression  GERD (REFLUX)  is an extremely common cause of respiratory symptoms, many times with no significant heartburn at all.    It can be treated with medication, but also with lifestyle changes including avoidance of late meals, excessive alcohol, smoking cessation, and avoid fatty foods, chocolate, peppermint, colas, red wine, and acidic juices such as orange juice.  NO MINT OR MENTHOL PRODUCTS SO NO COUGH DROPS   USE HARD CANDY INSTEAD (jolley ranchers or Stover's or Lifesavers (all available in sugarless versions) NO OIL BASED VITAMINS - use powdered substitutes.    Please see patient coordinator before you leave today  to schedule Methacholine challenge test while still on acid  reflux medications and diet and I wiil call you with results to discuss next step

## 2017-11-06 ENCOUNTER — Ambulatory Visit: Payer: 59 | Admitting: Internal Medicine

## 2017-11-06 ENCOUNTER — Telehealth: Payer: Self-pay | Admitting: Internal Medicine

## 2017-11-06 ENCOUNTER — Encounter: Payer: Self-pay | Admitting: *Deleted

## 2017-11-06 ENCOUNTER — Ambulatory Visit (INDEPENDENT_AMBULATORY_CARE_PROVIDER_SITE_OTHER)
Admission: RE | Admit: 2017-11-06 | Discharge: 2017-11-06 | Disposition: A | Discharge status: Home / Self Care | Payer: 59 | Source: Ambulatory Visit | Attending: Internal Medicine | Admitting: Internal Medicine

## 2017-11-06 VITALS — BP 126/76 | HR 74 | Temp 98.4°F | Ht 64.0 in | Wt 240.0 lb

## 2017-11-06 DIAGNOSIS — J45991 Cough variant asthma: Secondary | ICD-10-CM

## 2017-11-06 LAB — NITRIC OXIDE: NITRIC OXIDE: 34

## 2017-11-06 MED ORDER — TRAMADOL HCL 50 MG PO TABS: ORAL_TABLET | ORAL | 0 refills | Status: DC

## 2017-11-06 MED ORDER — MONTELUKAST SODIUM 10 MG PO TABS: ORAL_TABLET | ORAL | 2 refills | Status: DC

## 2017-11-06 MED ORDER — PREDNISONE 10 MG PO TABS: ORAL_TABLET | ORAL | 0 refills | Status: DC

## 2017-11-06 NOTE — Progress Notes (Signed)
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 Pulmonary Consult    Self referral.  Pt c/o cough and wheezing x 2 months. He states his cough is non prod. He gets SOB when he starts coughing. Cough is worse after exertion.    onset was acute in Aug 2018 s assoc uri/ rhinitis symptoms.  Pattern is day >> noct with temperature change appears to trigger the cough - eg walking in cold air then better when back in doors Also w/in 15 min of stopping ex then burns outside   sleeps fine though some cough at hs  Has son's inhaler = saba and no better  Has coughed to point of vomiting  rec Cyclical cough rx > resolved    10/30/17 acute ov for recurrent cough  rec Cyclical cough rx > started  pred  10/31/17 e but never took h1 but did take ppi/ h2 hs     11/07/2017 acute extended ov/Elisia Stepp re: recurrent uacs/ did not bring meds as req Chief Complaint  Patient presents with  . Acute Visit    Increased cough over the past wk. Cough is non prod. He states he had to leave work due to cough.   can't use voice and work s provoking severe  cough / really did not follow recs re use of tramadol nor 1st gen H1 blockers per guidelines    If not coughing> no sob  No obvious day to day or daytime variability or assoc excess/ purulent sputum or mucus plugs or hemoptysis or cp or chest tightness, subjective wheeze or  overt sinus or hb symptoms. No unusual exposure hx or h/o childhood pna/ asthma or knowledge of premature birth.  Sleeping ok flat p tramadol  without nocturnal  or early am exacerbation  of respiratory  c/o's or need for noct saba. Also denies any obvious fluctuation of symptoms with weather or environmental changes or other aggravating or alleviating factors except as outlined above   Current Allergies, Complete Past Medical History, Past Surgical History, Family History, and Social History were reviewed in [facility] electronic medical record.  ROS  The following are not active complaints unless bolded Hoarseness, sore throat, dysphagia, dental problems, itching, sneezing,  nasal congestion or discharge of excess mucus or purulent secretions, ear ache,   fever, chills, sweats, unintended wt loss or wt gain, classically pleuritic or exertional cp,  orthopnea pnd or leg swelling, presyncope, palpitations, abdominal pain, anorexia, nausea, vomiting, diarrhea  or change in bowel habits or change in bladder habits, change in stools or change in urine, dysuria, hematuria,  rash, arthralgias, visual complaints, headache with severe cough/ generalized , numbness, weakness or ataxia or problems with walking or coordination,  change in mood/affect or memory.        Current Meds  Medication Sig  .  dextromethorphan (DELSYM) 30 MG/5ML liquid Take by mouth as needed for cough.  . famotidine (PEPCID) 20 MG tablet One at bedtime  . pantoprazole (PROTONIX) 40 MG tablet Take 1 tablet (40 mg total) by mouth daily. Take 30-60 min before first meal of the day  . traMADol (ULTRAM) 50 MG tablet 1-2 every 4 hours as needed for cough or pain  . [DISCONTINUED] traMADol (ULTRAM) 50 MG tablet 1-2 every 4 hours as needed for cough or pain                 Objective:   Physical Exam  amb obese very hoarse wm with severe upper airway harsh coughing fits with voice use  11/06/2017       240   07/17/17 239 lb  (108.4 kg)  09/10/15 229 lb (103.9 kg)  10/07/13 210 lb (95.3 kg)    Vital signs reviewed - Note on arrival 02 sats  97% on RA     HEENT: nl dentition, turbinates bilaterally, and oropharynx. Nl external ear canals without cough reflex   NECK :  without JVD/Nodes/TM/ nl carotid upstrokes bilaterally   LUNGS: no acc muscle use,  Nl contour chest which is clear to A and P bilaterally without cough on insp or exp maneuvers   CV:  RRR  no s3 or murmur or increase in P2, and no edema   ABD:  soft and nontender with nl inspiratory excursion in the supine position. No bruits or organomegaly appreciated, bowel sounds nl  MS:  Nl gait/ ext warm without deformities, calf tenderness, cyanosis or clubbing No obvious joint restrictions   SKIN: warm and dry without lesions    NEURO:  alert, approp, nl sensorium with  no motor or cerebellar deficits apparent.        CXR PA and Lateral:   11/06/2017 :    I personally reviewed images and agree with radiology impression as follows:    No active cardiopulmonary disease.       Assessment:

## 2017-11-06 NOTE — Telephone Encounter (Signed)
Called and spoke with pt who stated he finished taking prednisone yesterday, 11/05/17 but states his cough is worse now than how it was at visit with MW 10/30/17.  Pt had to leave work yesterday due to coughing becoming a lot worse.  When pt got home from work, he did take the tramadol that was prescribed to see if that would help with his cough, which it did help some.  Pt states his cough is a hard dry cough and from how hard he is coughing he will develop a headache from it.  Dr. Sherene SiresWert, please advise on recommendations for pt. Thanks!

## 2017-11-06 NOTE — Telephone Encounter (Signed)
Ov with all meds/ bottles in hand asap - ok to add on

## 2017-11-06 NOTE — Patient Instructions (Signed)
Start singulair 10 mg each pm   The key to effective treatment for your cough is eliminating the non-stop cycle of cough you're stuck in long enough to let your airway heal completely and then see if there is anything still making you cough once you stop the cough suppression, but this should take no more than 5 days to figure out  First take delsym two tsp every 12 hours and supplement if needed with  tramadol 50 mg up to 2 every 4 hours to suppress the urge to cough at all or even clear your throat. Swallowing water or using ice chips/non mint and menthol containing candies (such as lifesavers or sugarless jolly ranchers) are also effective.  You should rest your voice and avoid activities that you know make you cough.  Once you have eliminated the cough for 3 straight days try reducing the tramadol first,  then the delsym as tolerated.    Prednisone 10 mg take  4 each am x 2 days,   2 each am x 2 days,  1 each am x 2 days and stop (this is to eliminate allergies and inflammation from coughing)  Protonix (pantoprazole) Take 30-60 min before first meal of the day and Pepcid 20 mg one bedtime plus chlorpheniramine 4 mg x 2 at bedtime (both available over the counter)  until cough is completely gone for at least a week without the need for cough suppression  GERD (REFLUX)  is an extremely common cause of respiratory symptoms, many times with no significant heartburn at all.    It can be treated with medication, but also with lifestyle changes including avoidance of late meals, excessive alcohol, smoking cessation, and avoid fatty foods, chocolate, peppermint, colas, red wine, and acidic juices such as orange juice.  NO MINT OR MENTHOL PRODUCTS SO NO COUGH DROPS   USE HARD CANDY INSTEAD (jolley ranchers or Stover's or Lifesavers (all available in sugarless versions) NO OIL BASED VITAMINS - use powdered substitutes.      Please remember to go to the  x-ray department downstairs in the basement  for  your tests - we will call you with the results when they are available.      I will call you when results of methacholine are available

## 2017-11-06 NOTE — Telephone Encounter (Signed)
Pt coming in today at 4:30 to see MW.  Nothing further needed at this time.

## 2017-11-07 ENCOUNTER — Encounter: Payer: Self-pay | Admitting: Internal Medicine

## 2017-11-07 NOTE — Assessment & Plan Note (Addendum)
FENO 07/17/2017  =   30  - Allergy profile 07/17/2017 >  Eos 0.2 /  IgE  232 with pos RAST for everything but cats and mold - cyclical cough rx 07/17/17 > resolved until end of Jan 2019 with resumption of outdoor biking off gerd rx - 10/30/2017 rec cyclical cough rx plus maint on gerd rx until does MCT > did not complete cyclical cough regimen  - start singulair 11/06/2017  -FENO 11/06/2017  =   34    Lack of cough resolution on a verified empirical regimen (which we have not accomoplished here yet) could mean an alternative diagnosis (cough variant asthma, which we need to r/o with MCT esp in view of atopy on allergy profile for which singulair trial needs to be started today)  , persistence of the disease state (eg sinusitis or bronchiectasis) , or inadequacy of currently available therapy (eg no medical rx available for non-acid gerd)   Of the three most common causes of  Sub-acute or recurrent or chronic cough, only one (GERD)  can actually contribute to/ trigger  the other two (asthma and post nasal drip syndrome)  and perpetuate the cylce of cough.  While not intuitively obvious, many patients with chronic low grade reflux do not cough until there is a primary insult that disturbs the protective epithelial barrier and exposes sensitive nerve endings.   This is typically viral but can be direct physical injury such as with an endotracheal tube.   The point is that once this occurs, it is difficult to eliminate the cycle  using anything but a maximally effective acid suppression regimen at least in the short run, accompanied by an appropriate diet to address non acid GERD.  1st gen H1 blockers per guidelines   and control / eliminate the cough itself for at least 3 days with tramadol    I had an extended discussion with the patient reviewing all relevant studies completed to date and  lasting 25 minutes of a 40  minute acute office visit addressing     re  severe non-specific but potentially very  serious refractory respiratory symptoms of uncertain and potentially multiple  etiologies.   Each maintenance medication was reviewed in detail including most importantly the difference between maintenance and prns and under what circumstances the prns are to be triggered using an action plan format that is not reflected in the computer generated alphabetically organized AVS.    Please see AVS for specific instructions unique to this office visit that I personally wrote and verbalized to the the pt in detail and then reviewed with pt  by my nurse highlighting any changes in therapy/plan of care  recommended at today's visit.

## 2017-11-07 NOTE — Assessment & Plan Note (Signed)
Body mass index is 41.2 kg/m.  -  trending up No results found for: TSH   Contributing to gerd risk/ doe/reviewed the need and the process to achieve and maintain neg calorie balance > defer f/u primary care including intermittently monitoring thyroid status

## 2017-11-07 NOTE — Progress Notes (Signed)
Spoke with pt and notified of results per Dr. Wert. Pt verbalized understanding and denied any questions. 

## 2017-11-15 ENCOUNTER — Ambulatory Visit (HOSPITAL_COMMUNITY)
Admission: RE | Admit: 2017-11-15 | Discharge: 2017-11-15 | Disposition: A | Discharge status: Home / Self Care | Payer: 59 | Source: Ambulatory Visit | Attending: Internal Medicine | Admitting: Internal Medicine

## 2017-11-15 DIAGNOSIS — J45991 Cough variant asthma: Secondary | ICD-10-CM | POA: Insufficient documentation

## 2017-11-15 DIAGNOSIS — J449 Chronic obstructive pulmonary disease, unspecified: Secondary | ICD-10-CM | POA: Insufficient documentation

## 2017-11-15 LAB — PULMONARY FUNCTION TEST
FEF 25-75 PRE: 2.55 L/s
FEF 25-75 Post: 1.97 L/sec
FEF2575-%Change-Post: -22 %
FEF2575-%Pred-Post: 61 %
FEF2575-%Pred-Pre: 79 %
FEV1-%Change-Post: -5 %
FEV1-%PRED-POST: 80 %
FEV1-%Pred-Pre: 85 %
FEV1-POST: 2.7 L
FEV1-PRE: 2.86 L
FEV1FVC-%Change-Post: 0 %
FEV1FVC-%Pred-Pre: 99 %
FEV6-%CHANGE-POST: -3 %
FEV6-%PRED-POST: 84 %
FEV6-%PRED-PRE: 87 %
FEV6-POST: 3.45 L
FEV6-PRE: 3.57 L
FEV6FVC-%CHANGE-POST: 1 %
FEV6FVC-%PRED-POST: 102 %
FEV6FVC-%Pred-Pre: 101 %
FVC-%CHANGE-POST: -4 %
FVC-%PRED-POST: 81 %
FVC-%Pred-Pre: 86 %
FVC-Post: 3.46 L
FVC-Pre: 3.63 L
POST FEV6/FVC RATIO: 100 %
Post FEV1/FVC ratio: 78 %
Pre FEV1/FVC ratio: 79 %
Pre FEV6/FVC Ratio: 98 %

## 2017-11-15 MED ORDER — METHACHOLINE 16 MG/ML NEB SOLN
2.0000 mL | Freq: Once | RESPIRATORY_TRACT | Status: AC
Start: 2017-11-15 — End: 2017-11-15
  Administered 2017-11-15: 32 mg via RESPIRATORY_TRACT
  Filled 2017-11-15: qty 2

## 2017-11-15 MED ORDER — METHACHOLINE 4 MG/ML NEB SOLN
2.0000 mL | Freq: Once | RESPIRATORY_TRACT | Status: AC
  Administered 2017-11-15: 8 mg via RESPIRATORY_TRACT
  Filled 2017-11-15: qty 2

## 2017-11-15 MED ORDER — ALBUTEROL SULFATE (2.5 MG/3ML) 0.083% IN NEBU
2.5000 mg | INHALATION_SOLUTION | Freq: Once | RESPIRATORY_TRACT | Status: AC
  Administered 2017-11-15: 2.5 mg via RESPIRATORY_TRACT

## 2017-11-15 MED ORDER — METHACHOLINE 0.25 MG/ML NEB SOLN
2.0000 mL | Freq: Once | RESPIRATORY_TRACT | Status: AC
  Administered 2017-11-15: 0.5 mg via RESPIRATORY_TRACT
  Filled 2017-11-15: qty 2

## 2017-11-15 MED ORDER — SODIUM CHLORIDE 0.9 % IN NEBU
3.0000 mL | INHALATION_SOLUTION | Freq: Once | RESPIRATORY_TRACT | Status: AC
  Administered 2017-11-15: 3 mL via RESPIRATORY_TRACT
  Filled 2017-11-15: qty 3

## 2017-11-15 MED ORDER — METHACHOLINE 0.0625 MG/ML NEB SOLN
2.0000 mL | Freq: Once | RESPIRATORY_TRACT | Status: AC
  Administered 2017-11-15: 0.125 mg via RESPIRATORY_TRACT
  Filled 2017-11-15: qty 2

## 2017-11-15 MED ORDER — METHACHOLINE 1 MG/ML NEB SOLN
2.0000 mL | Freq: Once | RESPIRATORY_TRACT | Status: AC
  Administered 2017-11-15: 2 mg via RESPIRATORY_TRACT
  Filled 2017-11-15: qty 2

## 2017-11-16 ENCOUNTER — Telehealth: Payer: Self-pay | Admitting: Internal Medicine

## 2017-11-16 NOTE — Progress Notes (Signed)
LMTCB

## 2017-11-16 NOTE — Telephone Encounter (Signed)
Made patient aware of results from PFT. Per MW patient needed appt. Appt made nothing further needed.

## 2017-11-20 ENCOUNTER — Ambulatory Visit: Payer: 59 | Admitting: Internal Medicine

## 2017-11-20 ENCOUNTER — Encounter: Payer: Self-pay | Admitting: Internal Medicine

## 2017-11-20 VITALS — BP 138/92 | HR 103 | Ht 64.0 in | Wt 244.0 lb

## 2017-11-20 DIAGNOSIS — J45991 Cough variant asthma: Secondary | ICD-10-CM | POA: Diagnosis not present

## 2017-11-20 MED ORDER — BUDESONIDE-FORMOTEROL FUMARATE 80-4.5 MCG/ACT IN AERO: 2.0000 | INHALATION_SPRAY | Freq: Two times a day (BID) | RESPIRATORY_TRACT | 0 refills | Status: DC

## 2017-11-20 MED ORDER — BUDESONIDE-FORMOTEROL FUMARATE 80-4.5 MCG/ACT IN AERO: 2.0000 | INHALATION_SPRAY | Freq: Two times a day (BID) | RESPIRATORY_TRACT | 11 refills | Status: DC

## 2017-11-20 NOTE — Assessment & Plan Note (Signed)
FENO 07/17/2017  =   30  - Allergy profile 07/17/2017 >  Eos 0.2 /  IgE  232 with pos RAST for everything but cats and mold - cyclical cough rx 07/17/17 > resolved until end of Jan 2019 with resumption of outdoor biking off gerd rx - 10/30/2017 rec cyclical cough rx plus maint on gerd rx until does MCT > did not complete cyclical cough regimen - start singulair 11/06/2017  -FENO 11/06/2017  =   34   -  MCT 11/15/2017 pos for asthma >  100% reproduced  Symptoms > rec continue singulair and then add symbicort 80 2bid if breakthru in spring  - 11/20/2017  After extensive coaching inhaler device  effectiveness =    90%   Reviewed studies/ progress on singulair > note cough gone now and no longer tramadol dep but def at risk of flare with spring seaons  Based on the study from NEJM  378; 20 p 1865 (2018) in pts with mild asthma it is reasonable to use low dose symbicort eg 80 2bid "prn" flare in this setting but I emphasized this was only shown with symbicort and takes advantage of the rapid onset of action but is not the same as "rescue therapy" but can be stopped once the acute symptoms have resolved and the need for rescue has been minimized (< 2 x weekly)    If not well controlled on symb/singulair may need formal allergy eval   I had an extended discussion with the patient reviewing all relevant studies completed to date and  lasting 15 to 20 minutes of a 25 minute visit    Each maintenance medication was reviewed in detail including most importantly the difference between maintenance and prns and under what circumstances the prns are to be triggered using an action plan format that is not reflected in the computer generated alphabetically organized AVS.    Please see AVS for specific instructions unique to this visit that I personally wrote and verbalized to the the pt in detail and then reviewed with pt  by my nurse highlighting any  changes in therapy recommended at today's visit to their plan of  care.

## 2017-11-20 NOTE — Patient Instructions (Addendum)
Automatic meds :  Singular and reflux medication = Pantoprazole (protonix) 40 mg   Take  30-60 min before first meal of the day and Pepcid (famotidine)  20 mg one @  bedtime until return to office    As needed  For drainage / throat tickle try take CHLORPHENIRAMINE  4 mg - take one every 4 hours as needed - available over the counter- may cause drowsiness so start with just a bedtime dose or two and see how you tolerate it before trying in daytime   For cough:  Delsym up to 2 tsp every 12 hours as needed     If still more coughing / chest tightness like you had on the Methacholine challenge, then start symbicort 80 Take 2 puffs first thing in am and then another 2 puffs about 12 hours later.    Please schedule a follow up office visit in 6 weeks, call sooner if needed

## 2017-11-20 NOTE — Assessment & Plan Note (Signed)
Body mass index is 41.88 kg/m.  -  trending up  No results found for: TSH   Contributing to gerd risk/ doe/reviewed the need and the process to achieve and maintain neg calorie balance > defer f/u primary care including intermittently monitoring thyroid status

## 2017-11-20 NOTE — Progress Notes (Signed)
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 Pulmonary Consult    Self referral.  Pt c/o cough and wheezing x 2 months. He states his cough is non prod. He gets SOB when he starts coughing. Cough is worse after exertion.    onset was acute in Aug 2018 s assoc uri/ rhinitis symptoms.  Pattern is day >> noct with temperature change appears to trigger the cough - eg walking in cold air then better when back in doors Also w/in 15 min of stopping ex then burns outside   sleeps fine though some cough at hs  Has son's inhaler = saba and no better  Has coughed to point of vomiting  rec Cyclical cough rx > resolved    10/30/17 acute ov for recurrent cough  rec Cyclical cough rx > started  pred  10/31/17 e but never took h1 but did take ppi/ h2 hs     11/07/2017 acute extended ov/Aisea Bouldin re: recurrent uacs/ did not bring meds as req Chief Complaint  Patient presents with  . Acute Visit    Increased cough over the past wk. Cough is non prod. He states he had to leave work due to cough.   can't use voice and work s provoking severe  cough / really did not follow recs re use of tramadol nor 1st gen H1 blockers per guidelines   If not coughing> no sob rec Start singulair 10 mg each pm  First take delsym two tsp every 12 hours and supplement if needed with  tramadol 50  mg up to 2 every 4 hours to suppress the urge to cough at all or even clear your throat.  Once you have eliminated the cough for 3 straight days try reducing the tramadol first,  then the delsym as tolerated.   Prednisone 10 mg take  4 each am x 2 days,   2 each am x 2 days,  1 each am x 2 days and stop (this is to eliminate allergies and inflammation from coughing) Protonix (pantoprazole) Take 30-60 min before first meal of the day and Pepcid 20 mg one bedtime plus chlorpheniramine 4 mg x 2 at bedtime (both available over the counter)  until cough is completely gone for at least a week without the need for cough suppression GERD diet MCT test 11/15/17  Pos asthma     11/20/2017  f/u ov/Anyra Kaufman re:  Cough variant asthma/  Chief Complaint  Patient presents with  . Follow-up    here to discuss MCT results.  He states that he has not coughed in the past 4-5 days.   Dyspnea:  Not working out but nl activity tol Cough: none, no tramadol still some delsym Sleep: fine p chlorpheniramine x 2  hs  - has not tried it SABA use:  None   No obvious day to day or daytime variability or assoc excess/ purulent sputum or mucus plugs or hemoptysis or cp or chest tightness, subjective wheeze or overt sinus or hb symptoms. No unusual exposure hx or h/o childhood pna/ asthma or knowledge of premature birth.  Sleeping ok flat without nocturnal  or early am exacerbation  of respiratory  c/o's or need for noct saba. Also denies any obvious fluctuation of symptoms with weather or environmental changes or other aggravating or alleviating factors except as outlined above   Current Allergies, Complete Past Medical History, Past Surgical History, Family History, and Social History were reviewed in [facility] electronic medical record.  ROS  The following are not active complaints unless bolded Hoarseness, sore throat, dysphagia, dental problems, itching, sneezing,  nasal congestion or discharge of excess mucus or  purulent secretions, ear ache,   fever, chills, sweats, unintended wt loss or wt gain, classically pleuritic or exertional cp,  orthopnea pnd or leg swelling, presyncope, palpitations, abdominal pain, anorexia, nausea, vomiting, diarrhea  or change in bowel habits or change in bladder habits, change in stools or change in urine, dysuria, hematuria,  rash, arthralgias, visual complaints, headache, numbness, weakness or ataxia or problems with walking or coordination,  change in mood/affect or memory.        Current Meds  Medication Sig  . dextromethorphan (DELSYM) 30 MG/5ML liquid Take by mouth as needed for cough.  . famotidine (PEPCID) 20 MG tablet One at bedtime  . montelukast (SINGULAIR) 10 MG tablet One at bedtime every night  . pantoprazole (PROTONIX) 40 MG tablet Take 1 tablet (40 mg total) by mouth daily. Take 30-60 min before first meal of the day  . [DISCONTINUED] traMADol (ULTRAM) 50 MG tablet 1-2 every 4 hours as needed for cough or pain            Objective:   Physical Exam   11/20/2017       244  11/06/2017       240   07/17/17 239 lb (108.4 kg)  09/10/15 229 lb (103.9 kg)  10/07/13 210 lb (95.3 kg)    Vital signs reviewed - Note on arrival 02 sats  96% on RA and bp  138/92      HEENT: nl dentition,  and oropharynx. Nl external ear canals without cough reflex - moderate bilateral non-specific turbinate edema     NECK :  without JVD/Nodes/TM/ nl carotid upstrokes bilaterally   LUNGS: no acc muscle use,  Nl contour chest which is clear to A and P bilaterally without cough on insp or exp maneuvers   CV:  RRR  no s3 or murmur or increase in P2, and no edema   ABD:  soft and nontender with nl inspiratory excursion in the supine position. No bruits or organomegaly appreciated, bowel sounds nl  MS:  Nl gait/ ext warm without deformities, calf tenderness, cyanosis or clubbing No obvious joint restrictions   SKIN: warm and dry without lesions    NEURO:  alert, approp, nl  sensorium with  no motor or cerebellar deficits apparent.      CXR PA and Lateral:   11/06/2017 :    I personally reviewed images and agree with radiology impression as follows:    No active cardiopulmonary disease.       Assessment:

## 2018-01-01 ENCOUNTER — Ambulatory Visit: Payer: 59 | Admitting: Internal Medicine

## 2018-01-19 ENCOUNTER — Encounter: Payer: Self-pay | Admitting: Internal Medicine

## 2018-01-19 ENCOUNTER — Ambulatory Visit (INDEPENDENT_AMBULATORY_CARE_PROVIDER_SITE_OTHER): Payer: 59 | Admitting: Internal Medicine

## 2018-01-19 VITALS — BP 126/80 | HR 73 | Ht 64.0 in | Wt 246.8 lb

## 2018-01-19 DIAGNOSIS — J45991 Cough variant asthma: Secondary | ICD-10-CM | POA: Diagnosis not present

## 2018-01-19 LAB — NITRIC OXIDE: Nitric Oxide: 15

## 2018-01-19 NOTE — Patient Instructions (Addendum)
Resume symbicort 80 Take 2 puffs first thing in am and then another 2 puffs about 12 hours later until no cough at all for a week then try to taper off    Please schedule a follow up visit in 3 months but call sooner if needed
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
## 2018-01-21 ENCOUNTER — Encounter: Payer: Self-pay | Admitting: Internal Medicine

## 2018-01-21 NOTE — Assessment & Plan Note (Signed)
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 >  100% reproduced  Symptoms > rec continue singulair and then add symbicort 80 2bid if breakthru in spring  - 11/20/2017  After extensive coaching inhaler device  effectiveness =    90%   - FENO 01/19/2018  =   15   Again very atypical symptoms not the same as at MCT but could be a form of cough variant asthma with component of vcd as well, esp suggested by choking and inability to speak during spell  Rec suppress cough with delsym/ resume symbicort 80 2bid on a trial basis  I had an extended discussion with the patient reviewing all relevant studies completed to date and  lasting 15 to 20 minutes of a 25 minute visit    Each maintenance medication was reviewed in detail including most importantly the difference between maintenance and prns and under what circumstances the prns are to be triggered using an action plan format that is not reflected in the computer generated alphabetically organized AVS.    Please see AVS for specific instructions unique to this visit that I personally wrote and verbalized to the the pt in detail and then reviewed with pt  by my nurse highlighting any  changes in therapy recommended at today's visit to their plan of care.

## 2018-01-29 ENCOUNTER — Other Ambulatory Visit: Payer: Self-pay | Admitting: Internal Medicine

## 2018-01-29 DIAGNOSIS — J45991 Cough variant asthma: Secondary | ICD-10-CM

## 2018-04-23 ENCOUNTER — Ambulatory Visit: Payer: 59 | Admitting: Internal Medicine

## 2018-05-08 ENCOUNTER — Ambulatory Visit: Payer: 59 | Admitting: Internal Medicine

## 2018-05-08 ENCOUNTER — Encounter: Payer: Self-pay | Admitting: Internal Medicine

## 2018-05-08 VITALS — BP 138/78 | HR 71 | Ht 64.0 in | Wt 250.6 lb

## 2018-05-08 DIAGNOSIS — J45991 Cough variant asthma: Secondary | ICD-10-CM

## 2018-05-08 DIAGNOSIS — R0683 Snoring: Secondary | ICD-10-CM | POA: Insufficient documentation

## 2018-05-08 MED ORDER — ALBUTEROL SULFATE HFA 108 (90 BASE) MCG/ACT IN AERS: INHALATION_SPRAY | RESPIRATORY_TRACT | 1 refills | Status: AC

## 2018-05-08 NOTE — Assessment & Plan Note (Signed)
Body mass index is 43.02 kg/m.  -  trending up still  No results found for: TSH   Contributing to gerd risk/ doe/reviewed the need and the process to achieve and maintain neg calorie balance > defer f/u primary care including intermittently monitoring thyroid status
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
## 2018-05-08 NOTE — Assessment & Plan Note (Addendum)
FENO 07/17/2017  =   30  - Allergy profile 07/17/2017 >  Eos 0.2 /  IgE  232 with pos RAST for everything but cats and mold - cyclical cough rx 32/99/24 > resolved until end of Jan 2019 with resumption of outdoor biking off gerd rx - 11/02/8339 rec cyclical cough rx plus maint on gerd rx until does MCT > did not complete cyclical cough regimen - start singulair 11/06/2017  -FENO 11/06/2017  =   34  -  MCT 11/15/2017 pos for asthma >  100% reproduced  Symptoms > rec continue singulair and then add symbicort 80 2bid if breakthru in spring  - 11/20/2017  After extensive coaching inhaler device  effectiveness =    90%  - FENO 01/19/2018  =   15 off symbicort during flare   On just singulair / gerd rx and 1st gen H1 blockers per guidelines>> All goals of chronic asthma control met including optimal function and elimination of symptoms with minimal need for rescue therapy.  Contingencies discussed in full including contacting this office immediately if not controlling the symptoms using the rule of two's.     Ok to use symbicort "prn" in this setting based on Based on two studies from NEJM  378; 20 p 1865 (2018) and 380 : p2020-30 (2019) in pts with mild asthma it is reasonable to use low dose symbicort eg 80 2bid "prn" flare in this setting but I emphasized this was only shown with symbicort and takes advantage of the rapid onset of action but is not the same as "rescue therapy" but can be stopped once the acute symptoms have resolved and the need for rescue has been minimized (< 2 x weekly)     Reviewed approp use of saba using the rule of 2's   Pulmonary f/u can be q 6 m - sooner prn

## 2018-05-08 NOTE — Assessment & Plan Note (Signed)
In absence of hypersomnolence rec keep off back/ lose wt/ f/u with sleep study if any daytime hypersomnolence/ advised    I had an extended discussion with the patient reviewing all relevant studies completed to date and  lasting 15 to 20 minutes of a 25 minute visit    Each maintenance medication was reviewed in detail including most importantly the difference between maintenance and prns and under what circumstances the prns are to be triggered using an action plan format that is not reflected in the computer generated alphabetically organized AVS.    Please see AVS for specific instructions unique to this visit that I personally wrote and verbalized to the the pt in detail and then reviewed with pt  by my nurse highlighting any  changes in therapy recommended at today's visit to their plan of care.

## 2018-05-08 NOTE — Patient Instructions (Addendum)
Symbicort 80  Up to 2 puffs every 12 hours if having any symptoms at all or having to use your rescue at all    Only use your albuterol as a rescue medication to be used if you can't catch your breath by resting or doing a relaxed purse lip breathing pattern.  - The less you use it, the better it will work when you need it. - Ok to use up to 2 puffs  every 4 hours if you must but call for immediate appointment if use goes up over your usual need - Don't leave home without it !!  (think of it like the spare tire for your car)    Please schedule a follow up visit in  6  months but call sooner if needed

## 2018-06-25 ENCOUNTER — Other Ambulatory Visit: Payer: Self-pay | Admitting: Internal Medicine

## 2018-06-25 DIAGNOSIS — J45991 Cough variant asthma: Secondary | ICD-10-CM

## 2018-06-25 MED ORDER — FAMOTIDINE 20 MG PO TABS: ORAL_TABLET | ORAL | 5 refills | Status: DC

## 2018-06-25 MED ORDER — PANTOPRAZOLE SODIUM 40 MG PO TBEC: DELAYED_RELEASE_TABLET | ORAL | 5 refills | Status: DC

## 2018-06-25 MED ORDER — MONTELUKAST SODIUM 10 MG PO TABS: ORAL_TABLET | ORAL | 5 refills | Status: DC

## 2018-10-31 ENCOUNTER — Ambulatory Visit: Payer: 59 | Admitting: Internal Medicine

## 2018-12-03 ENCOUNTER — Ambulatory Visit: Payer: 59 | Admitting: Internal Medicine

## 2019-01-30 ENCOUNTER — Other Ambulatory Visit: Payer: Self-pay | Admitting: Internal Medicine

## 2019-01-30 DIAGNOSIS — J45991 Cough variant asthma: Secondary | ICD-10-CM

## 2019-03-06 ENCOUNTER — Other Ambulatory Visit: Payer: Self-pay | Admitting: Otolaryngology

## 2019-03-06 DIAGNOSIS — R221 Localized swelling, mass and lump, neck: Secondary | ICD-10-CM

## 2019-03-14 ENCOUNTER — Other Ambulatory Visit: Payer: Self-pay | Admitting: Otolaryngology

## 2019-03-20 ENCOUNTER — Ambulatory Visit
Admission: RE | Admit: 2019-03-20 | Discharge: 2019-03-20 | Disposition: A | Discharge status: Home / Self Care | Payer: 59 | Source: Ambulatory Visit | Attending: Otolaryngology | Admitting: Otolaryngology

## 2019-03-20 ENCOUNTER — Other Ambulatory Visit: Payer: Self-pay

## 2019-03-20 DIAGNOSIS — R221 Localized swelling, mass and lump, neck: Secondary | ICD-10-CM

## 2019-03-20 MED ORDER — IOPAMIDOL (ISOVUE-300) INJECTION 61%
75.0000 mL | Freq: Once | INTRAVENOUS | Status: AC | PRN
Start: 2019-03-20 — End: 2019-03-20
  Administered 2019-03-20: 75 mL via INTRAVENOUS

## 2019-09-11 ENCOUNTER — Other Ambulatory Visit: Payer: Self-pay | Admitting: Internal Medicine

## 2019-09-11 DIAGNOSIS — J45991 Cough variant asthma: Secondary | ICD-10-CM

## 2019-09-11 MED ORDER — FAMOTIDINE 20 MG PO TABS: ORAL_TABLET | ORAL | 0 refills | Status: DC

## 2019-09-11 MED ORDER — PANTOPRAZOLE SODIUM 40 MG PO TBEC: DELAYED_RELEASE_TABLET | ORAL | 0 refills | Status: DC

## 2019-10-04 ENCOUNTER — Other Ambulatory Visit: Payer: Self-pay | Admitting: Internal Medicine

## 2019-10-04 DIAGNOSIS — J45991 Cough variant asthma: Secondary | ICD-10-CM

## 2019-10-09 ENCOUNTER — Ambulatory Visit: Payer: 59 | Admitting: Adult Health

## 2019-10-10 ENCOUNTER — Ambulatory Visit (INDEPENDENT_AMBULATORY_CARE_PROVIDER_SITE_OTHER): Payer: 59 | Admitting: Adult Health

## 2019-10-10 ENCOUNTER — Encounter: Payer: Self-pay | Admitting: Adult Health

## 2019-10-10 ENCOUNTER — Other Ambulatory Visit: Payer: Self-pay

## 2019-10-10 DIAGNOSIS — J45991 Cough variant asthma: Secondary | ICD-10-CM | POA: Diagnosis not present

## 2019-10-10 MED ORDER — MONTELUKAST SODIUM 10 MG PO TABS: ORAL_TABLET | ORAL | 11 refills | Status: DC

## 2019-10-10 MED ORDER — FAMOTIDINE 20 MG PO TABS
ORAL_TABLET | ORAL | 11 refills | Status: DC
Start: 2019-10-10 — End: 2023-03-29

## 2019-10-10 MED ORDER — PANTOPRAZOLE SODIUM 40 MG PO TBEC: DELAYED_RELEASE_TABLET | ORAL | 11 refills | Status: DC

## 2019-10-10 NOTE — Patient Instructions (Addendum)
Continue on Symbicort 2 puffs daily .  Albuterol inhaler As needed  Wheezing  Singulair daily  Follow up with Dr. Sherene Sires  In 1 year and As needed

## 2019-10-10 NOTE — Progress Notes (Signed)
@Patient  ID: Jeremiah Williams, male    DOB: 28-Jan-1971, 49 y.o.   MRN: [id number]  Chief Complaint  Patient presents with  . Follow-up    Asthma     Referring provider: No ref. provider found  HPI: 49 yo male never smoker followed for cough variant asthma  TEST/EVENTS :  Allergy profile 07/17/2017 >  Eos 0.2 /  IgE  232 with pos RAST for everything but cats and mold - cyclical cough rx 21/30/86 > resolved until end of Jan 2019 with resumption of outdoor biking off gerd rx - 01/31/8468 rec cyclical cough rx plus maint on gerd rx until does MCT > did not complete cyclical cough regimen - start singulair 11/06/2017  -FENO 11/06/2017  =   34  -  MCT 11/15/2017 pos for asthma >  100% reproduced  Symptoms > rec continue singulair and then add symbicort 80 2bid if breakthru in spring  - 11/20/2017  After extensive coaching inhaler device  effectiveness =    90%  - FENO 01/19/2018  =   15 off symbicort during flare   10/10/2019 Follow up : Cough variant asthma Patient presents for a follow-up for his asthma.  Last seen in August 2019.  Patient says he is doing well with his asthma.  He is very active and denies any flare of cough or wheezing.  He denies any increased [place] use.  He remains on Symbicort and uses this once daily.  Is on Singulair daily.  Has rare use of his albuterol inhaler He says he exercises and rides [place] on a regular basis without any difficulty  No Known Allergies  Immunization History  Administered Date(s) Administered  . Tdap 03/03/2013    Past Medical History:  Diagnosis Date  . Osteoarthritis    right shoulder    Tobacco History: Social History   Tobacco Use  Smoking Status Never Smoker  Smokeless Tobacco Never Used   Counseling given: Not Answered   Outpatient Medications Prior to Visit  Medication Sig Dispense Refill  . albuterol (PROAIR HFA) 108 (90 Base) MCG/ACT inhaler 2 puffs every 4 hours as needed only  if your can't catch your breath 1  Inhaler 1  . budesonide-formoterol (SYMBICORT) 80-4.5 MCG/ACT inhaler Inhale 2 puffs into the lungs 2 (two) times daily. 1 Inhaler 11  . chlorpheniramine (CHLOR-TRIMETON) 4 MG tablet Take 4 mg by mouth every 4 (four) hours as needed for allergies.    Marland Kitchen dextromethorphan (DELSYM) 30 MG/5ML liquid Take by mouth as needed for cough.    . famotidine (PEPCID) 20 MG tablet TAKE 1 TABLET BY MOUTH EVERY EVENING AT BEDTIME 30 tablet 0  . montelukast (SINGULAIR) 10 MG tablet TAKE 1 TABLET BY MOUTH EVERYDAY AT BEDTIME 30 tablet 5  . pantoprazole (PROTONIX) 40 MG tablet TAKE 1 TABLET BY MOUTH EVERY DAY 30 TO 60 MINUTES BEFORE FIRST MEAL OF THE DAY 30 tablet 0   No facility-administered medications prior to visit.     Review of Systems:   Constitutional:   No  weight loss, night sweats,  Fevers, chills, fatigue, or  lassitude.  HEENT:   No headaches,  Difficulty swallowing,  Tooth/dental problems, or  Sore throat,                No sneezing, itching, ear ache, + nasal congestion, post nasal drip,   CV:  No chest pain,  Orthopnea, PND, swelling in lower extremities, anasarca, dizziness, palpitations, syncope.   GI  No  heartburn, indigestion, abdominal pain, nausea, vomiting, diarrhea, change in bowel habits, loss of appetite, bloody stools.   Resp: No shortness of breath with exertion or at rest.  No excess mucus, no productive cough,  No non-productive cough,  No coughing up of blood.  No change in color of mucus.  No wheezing.  No chest wall deformity  Skin: no rash or lesions.  GU: no dysuria, change in color of urine, no urgency or frequency.  No flank pain, no hematuria   MS:  No joint pain or swelling.  No decreased range of motion.  No back pain.    Physical Exam  BP 128/78 (BP Location: Left Arm, Cuff Size: Large)   Pulse 87   Temp 98.1 F (36.7 C) (Temporal)   Ht 5\' 4"  (1.626 m)   Wt 245 lb 6.4 oz (111.3 kg)   SpO2 98% Comment: RA  BMI 42.12 kg/m   GEN: A/Ox3; pleasant , NAD,  well nourished    HEENT:  [place]/AT,   NOSE-clear, THROAT-clear, no lesions, no postnasal drip or exudate noted.   NECK:  Supple w/ fair ROM; no JVD;  no lymphadenopathy.    RESP  Clear  P & A; w/o, wheezes/ rales/ or rhonchi. no accessory muscle use, no dullness to percussion  CARD:  RRR, no m/r/g, no peripheral edema, pulses intact, no cyanosis or clubbing.  GI:   Soft & nt; nml bowel sounds; no organomegaly or masses detected.   Musco: Warm bil, no deformities or joint swelling noted.   Neuro: alert, no focal deficits noted.    Skin: Warm, no lesions or rashes    Lab Results:  CBC  BNP No results found for: BNP  ProBNP No results found for: PROBNP  Imaging: No results found.    PFT Results Latest Ref Rng & Units 11/15/2017  FVC-Pre L 3.63  FVC-Predicted Pre % 86  FVC-Post L 3.46  FVC-Predicted Post % 81  Pre FEV1/FVC % % 79  Post FEV1/FCV % % 78  FEV1-Pre L 2.86  FEV1-Predicted Pre % 85  FEV1-Post L 2.70    Lab Results  Component Value Date   NITRICOXIDE 15 01/19/2018        Assessment & Plan:   Cough variant asthma vs UACS from gerd or pnds Compensated on present regimen.  Continue on trigger prevention  Plan  Patient Instructions  Continue on Symbicort 2 puffs daily .  Albuterol inhaler As needed  Wheezing  Singulair daily  Follow up with Dr. 01/21/2018  In 1 year and As needed        Total patient care time 22 minutes  Mckenize Mezera, NP 10/10/2019

## 2019-10-10 NOTE — Assessment & Plan Note (Signed)
Compensated on present regimen.  Continue on trigger prevention  Plan  Patient Instructions  Continue on Symbicort 2 puffs daily .  Albuterol inhaler As needed  Wheezing  Singulair daily  Follow up with Dr. Sherene Sires  In 1 year and As needed

## 2020-03-26 ENCOUNTER — Other Ambulatory Visit: Payer: Self-pay | Admitting: Urology

## 2020-04-20 ENCOUNTER — Encounter (HOSPITAL_BASED_OUTPATIENT_CLINIC_OR_DEPARTMENT_OTHER): Payer: Self-pay | Admitting: Urology

## 2020-04-20 ENCOUNTER — Other Ambulatory Visit: Payer: Self-pay

## 2020-04-20 NOTE — Progress Notes (Signed)
Spoke w/ via phone for pre-op interview--- PT Lab needs dos---- no              Lab results------ no COVID test ------ 04-21-2020 @ 1115 Arrive at ------- 1015 NPO after MN NO Solid Food.  Clear liquids from MN until--- 0915 then nothing by mouth Medications to take morning of surgery ----- Protonix and do Symbicort inhaler w/ sips of water Diabetic medication ----- n/a Patient Special Instructions ----- asked to bring rescue inhaler dos Pre-Op special Istructions ----- n/a Patient verbalized understanding of instructions that were given at this phone interview. Patient denies shortness of breath, chest pain, fever, cough a this phone interview.

## 2020-04-21 ENCOUNTER — Other Ambulatory Visit (HOSPITAL_COMMUNITY)
Admission: RE | Admit: 2020-04-21 | Discharge: 2020-04-21 | Disposition: A | Discharge status: Home / Self Care | Payer: 59 | Source: Ambulatory Visit | Attending: Urology | Admitting: Urology

## 2020-04-21 DIAGNOSIS — Z20822 Contact with and (suspected) exposure to covid-19: Secondary | ICD-10-CM | POA: Insufficient documentation

## 2020-04-21 DIAGNOSIS — Z01812 Encounter for preprocedural laboratory examination: Secondary | ICD-10-CM | POA: Insufficient documentation

## 2020-04-21 LAB — SARS CORONAVIRUS 2 (TAT 6-24 HRS): SARS Coronavirus 2: NEGATIVE

## 2020-04-24 ENCOUNTER — Ambulatory Visit (HOSPITAL_BASED_OUTPATIENT_CLINIC_OR_DEPARTMENT_OTHER): Payer: 59 | Admitting: Anesthesiology

## 2020-04-24 ENCOUNTER — Encounter (HOSPITAL_BASED_OUTPATIENT_CLINIC_OR_DEPARTMENT_OTHER): Payer: Self-pay | Admitting: Urology

## 2020-04-24 ENCOUNTER — Ambulatory Visit (HOSPITAL_BASED_OUTPATIENT_CLINIC_OR_DEPARTMENT_OTHER)
Admission: RE | Admit: 2020-04-24 | Discharge: 2020-04-24 | Disposition: A | Discharge status: Home / Self Care | Payer: 59 | Attending: Urology | Admitting: Urology

## 2020-04-24 ENCOUNTER — Encounter (HOSPITAL_BASED_OUTPATIENT_CLINIC_OR_DEPARTMENT_OTHER)
Admission: RE | Disposition: A | Discharge status: Home / Self Care | Payer: Self-pay | Source: Home / Self Care | Attending: Urology

## 2020-04-24 DIAGNOSIS — K219 Gastro-esophageal reflux disease without esophagitis: Secondary | ICD-10-CM | POA: Insufficient documentation

## 2020-04-24 DIAGNOSIS — N4340 Spermatocele of epididymis, unspecified: Secondary | ICD-10-CM | POA: Diagnosis not present

## 2020-04-24 HISTORY — DX: Allergic rhinitis, unspecified: J30.9

## 2020-04-24 HISTORY — DX: Gastro-esophageal reflux disease without esophagitis: K21.9

## 2020-04-24 HISTORY — DX: Spermatocele of epididymis, unspecified: N43.40

## 2020-04-24 HISTORY — DX: Cough variant asthma: J45.991

## 2020-04-24 HISTORY — PX: SPERMATOCELECTOMY: SHX2420

## 2020-04-24 HISTORY — DX: Presence of spectacles and contact lenses: Z97.3

## 2020-04-24 SURGERY — EXCISION, SPERMATOCELE
Anesthesia: General | Site: Scrotum | Laterality: Right

## 2020-04-24 MED ORDER — PROPOFOL 10 MG/ML IV BOLUS
INTRAVENOUS | Status: AC
  Filled 2020-04-24: qty 40

## 2020-04-24 MED ORDER — TRAMADOL HCL 50 MG PO TABS: 50.0000 mg | ORAL_TABLET | Freq: Four times a day (QID) | ORAL | 0 refills | Status: AC | PRN

## 2020-04-24 MED ORDER — LIDOCAINE 2% (20 MG/ML) 5 ML SYRINGE
INTRAMUSCULAR | Status: AC
  Filled 2020-04-24: qty 5

## 2020-04-24 MED ORDER — ONDANSETRON HCL 4 MG/2ML IJ SOLN
INTRAMUSCULAR | Status: DC | PRN
  Administered 2020-04-24: 4 mg via INTRAVENOUS

## 2020-04-24 MED ORDER — ACETAMINOPHEN 500 MG PO TABS
1000.0000 mg | ORAL_TABLET | Freq: Once | ORAL | Status: AC
  Administered 2020-04-24: 1000 mg via ORAL

## 2020-04-24 MED ORDER — CEFAZOLIN SODIUM-DEXTROSE 2-4 GM/100ML-% IV SOLN
INTRAVENOUS | Status: AC
  Filled 2020-04-24: qty 100

## 2020-04-24 MED ORDER — LACTATED RINGERS IV SOLN: INTRAVENOUS | Status: DC

## 2020-04-24 MED ORDER — DEXAMETHASONE SODIUM PHOSPHATE 10 MG/ML IJ SOLN
INTRAMUSCULAR | Status: AC
  Filled 2020-04-24: qty 1

## 2020-04-24 MED ORDER — OXYCODONE HCL 5 MG PO TABS
ORAL_TABLET | ORAL | Status: AC
  Filled 2020-04-24: qty 1

## 2020-04-24 MED ORDER — MIDAZOLAM HCL 2 MG/2ML IJ SOLN
INTRAMUSCULAR | Status: AC
  Filled 2020-04-24: qty 2

## 2020-04-24 MED ORDER — PROPOFOL 10 MG/ML IV BOLUS
INTRAVENOUS | Status: DC | PRN
  Administered 2020-04-24: 200 mg via INTRAVENOUS

## 2020-04-24 MED ORDER — OXYCODONE HCL 5 MG PO TABS
5.0000 mg | ORAL_TABLET | Freq: Once | ORAL | Status: AC | PRN
  Administered 2020-04-24: 5 mg via ORAL

## 2020-04-24 MED ORDER — DEXAMETHASONE SODIUM PHOSPHATE 10 MG/ML IJ SOLN
INTRAMUSCULAR | Status: DC | PRN
  Administered 2020-04-24: 10 mg via INTRAVENOUS

## 2020-04-24 MED ORDER — ACETAMINOPHEN 500 MG PO TABS
ORAL_TABLET | ORAL | Status: AC
  Filled 2020-04-24: qty 2

## 2020-04-24 MED ORDER — LIDOCAINE 2% (20 MG/ML) 5 ML SYRINGE
INTRAMUSCULAR | Status: DC | PRN
  Administered 2020-04-24: 80 mg via INTRAVENOUS

## 2020-04-24 MED ORDER — ONDANSETRON HCL 4 MG/2ML IJ SOLN
INTRAMUSCULAR | Status: AC
  Filled 2020-04-24: qty 2

## 2020-04-24 MED ORDER — FENTANYL CITRATE (PF) 100 MCG/2ML IJ SOLN
INTRAMUSCULAR | Status: DC | PRN
  Administered 2020-04-24 (×4): 25 ug via INTRAVENOUS

## 2020-04-24 MED ORDER — OXYCODONE HCL 5 MG/5ML PO SOLN: 5.0000 mg | Freq: Once | ORAL | Status: AC | PRN

## 2020-04-24 MED ORDER — CEPHALEXIN 500 MG PO CAPS
500.0000 mg | ORAL_CAPSULE | Freq: Two times a day (BID) | ORAL | 0 refills | Status: AC
Start: 2020-04-24 — End: 2020-04-27

## 2020-04-24 MED ORDER — FENTANYL CITRATE (PF) 100 MCG/2ML IJ SOLN
INTRAMUSCULAR | Status: AC
  Filled 2020-04-24: qty 2

## 2020-04-24 MED ORDER — FENTANYL CITRATE (PF) 100 MCG/2ML IJ SOLN
25.0000 ug | INTRAMUSCULAR | Status: DC | PRN
  Administered 2020-04-24: 50 ug via INTRAVENOUS
  Administered 2020-04-24: 25 ug via INTRAVENOUS

## 2020-04-24 MED ORDER — CEFAZOLIN SODIUM-DEXTROSE 2-4 GM/100ML-% IV SOLN
2.0000 g | Freq: Once | INTRAVENOUS | Status: AC
  Administered 2020-04-24: 2 g via INTRAVENOUS

## 2020-04-24 MED ORDER — PROMETHAZINE HCL 25 MG/ML IJ SOLN: 6.2500 mg | INTRAMUSCULAR | Status: DC | PRN

## 2020-04-24 MED ORDER — MIDAZOLAM HCL 5 MG/5ML IJ SOLN
INTRAMUSCULAR | Status: DC | PRN
  Administered 2020-04-24: 2 mg via INTRAVENOUS

## 2020-04-24 MED ORDER — BUPIVACAINE HCL (PF) 0.25 % IJ SOLN
INTRAMUSCULAR | Status: DC | PRN
  Administered 2020-04-24: 5 mL

## 2020-04-24 SURGICAL SUPPLY — 44 items
ADH SKN CLS APL DERMABOND .7 (GAUZE/BANDAGES/DRESSINGS) ×1
BLADE HEX COATED 2.75 (ELECTRODE) ×3 IMPLANT
BLADE SURG 15 STRL LF DISP TIS (BLADE) ×1 IMPLANT
BLADE SURG 15 STRL SS (BLADE) ×3
BNDG GAUZE ELAST 4 BULKY (GAUZE/BANDAGES/DRESSINGS) ×3 IMPLANT
COVER BACK TABLE 60X90IN (DRAPES) ×3 IMPLANT
COVER MAYO STAND STRL (DRAPES) ×3 IMPLANT
COVER WAND RF STERILE (DRAPES) ×3 IMPLANT
DERMABOND ADVANCED (GAUZE/BANDAGES/DRESSINGS) ×2
DERMABOND ADVANCED .7 DNX12 (GAUZE/BANDAGES/DRESSINGS) ×1 IMPLANT
DRAPE LAPAROTOMY 100X72 PEDS (DRAPES) ×3 IMPLANT
ELECT REM PT RETURN 9FT ADLT (ELECTROSURGICAL) ×3
ELECTRODE REM PT RTRN 9FT ADLT (ELECTROSURGICAL) ×1 IMPLANT
GLOVE BIO SURGEON STRL SZ 6.5 (GLOVE) ×1 IMPLANT
GLOVE BIO SURGEONS STRL SZ 6.5 (GLOVE) ×1
GLOVE BIOGEL M STRL SZ7.5 (GLOVE) ×3 IMPLANT
GLOVE BIOGEL PI IND STRL 7.0 (GLOVE) IMPLANT
GLOVE BIOGEL PI IND STRL 7.5 (GLOVE) ×1 IMPLANT
GLOVE BIOGEL PI INDICATOR 7.0 (GLOVE) ×2
GLOVE BIOGEL PI INDICATOR 7.5 (GLOVE) ×4
GOWN STRL REUS W/ TWL LRG LVL3 (GOWN DISPOSABLE) IMPLANT
GOWN STRL REUS W/ TWL XL LVL3 (GOWN DISPOSABLE) ×1 IMPLANT
GOWN STRL REUS W/TWL LRG LVL3 (GOWN DISPOSABLE) ×3
GOWN STRL REUS W/TWL XL LVL3 (GOWN DISPOSABLE) ×3
NEEDLE HYPO 22GX1.5 SAFETY (NEEDLE) IMPLANT
NS IRRIG 500ML POUR BTL (IV SOLUTION) ×3 IMPLANT
PACK BASIN DAY SURGERY FS (CUSTOM PROCEDURE TRAY) ×3 IMPLANT
PENCIL BUTTON HOLSTER BLD 10FT (ELECTRODE) ×3 IMPLANT
SUPPORT SCROTAL LG STRP (MISCELLANEOUS) ×1 IMPLANT
SUPPORTER ATHLETIC LG (MISCELLANEOUS)
SUPPORTER ATHLETIC XL (MISCELLANEOUS) ×3
SUPPORTER ATHLETIC XL 3X44-50X (MISCELLANEOUS) IMPLANT
SUT CHROMIC 3 0 SH 27 (SUTURE) ×3 IMPLANT
SUT ETHILON 2 0 PS N (SUTURE) ×2 IMPLANT
SUT MNCRL AB 4-0 PS2 18 (SUTURE) ×2 IMPLANT
SUT VIC AB 2-0 UR5 27 (SUTURE) IMPLANT
SUT VIC AB 4-0 PS2 18 (SUTURE) IMPLANT
SUT VICRYL 0 TIES 12 18 (SUTURE) ×2 IMPLANT
SYR BULB EAR ULCER 3OZ GRN STR (SYRINGE) ×2 IMPLANT
SYR CONTROL 10ML LL (SYRINGE) IMPLANT
TOWEL OR 17X26 10 PK STRL BLUE (TOWEL DISPOSABLE) ×4 IMPLANT
TUBE CONNECTING 12'X1/4 (SUCTIONS) ×1
TUBE CONNECTING 12X1/4 (SUCTIONS) ×2 IMPLANT
YANKAUER SUCT BULB TIP NO VENT (SUCTIONS) ×3 IMPLANT

## 2020-04-24 NOTE — Anesthesia Preprocedure Evaluation (Addendum)
Anesthesia Evaluation  Patient identified by MRN, date of birth, ID band Patient awake    Reviewed: Allergy & Precautions, NPO status , Patient's Chart, lab work & pertinent test results  History of Anesthesia Complications Negative for: history of anesthetic complications  Airway Mallampati: II  TM Distance: >3 FB Neck ROM: Full    Dental  (+) Missing,    Pulmonary asthma ,    Pulmonary exam normal        Cardiovascular negative cardio ROS Normal cardiovascular exam     Neuro/Psych negative neurological ROS     GI/Hepatic GERD  Medicated and Controlled,  Endo/Other  Morbid obesityBMI 39  Renal/GU negative Renal ROS   Right spermatocele    Musculoskeletal  (+) Arthritis ,   Abdominal   Peds  Hematology negative hematology ROS (+)   Anesthesia Other Findings   Reproductive/Obstetrics                           Anesthesia Physical Anesthesia Plan  ASA: III  Anesthesia Plan: General   Post-op Pain Management:    Induction: Intravenous  PONV Risk Score and Plan: 2 and Treatment may vary due to age or medical condition, Ondansetron, Dexamethasone and Midazolam  Airway Management Planned: LMA  Additional Equipment: None  Intra-op Plan:   Post-operative Plan: Extubation in OR  Informed Consent: I have reviewed the patients History and Physical, chart, labs and discussed the procedure including the risks, benefits and alternatives for the proposed anesthesia with the patient or authorized representative who has indicated his/her understanding and acceptance.     Dental advisory given  Plan Discussed with: CRNA  Anesthesia Plan Comments:       Anesthesia Quick Evaluation

## 2020-04-24 NOTE — Anesthesia Postprocedure Evaluation (Signed)
Anesthesia Post Note  Patient: Jeremiah Williams  Procedure(s) Performed: SPERMATOCELECTOMY (Right Scrotum)     Patient location during evaluation: PACU Anesthesia Type: General Level of consciousness: awake and alert Pain management: pain level controlled Vital Signs Assessment: post-procedure vital signs reviewed and stable Respiratory status: spontaneous breathing, nonlabored ventilation, respiratory function stable and patient connected to nasal cannula oxygen Cardiovascular status: blood pressure returned to baseline and stable Postop Assessment: no apparent nausea or vomiting Anesthetic complications: no   No complications documented.  Last Vitals:  Vitals:   04/24/20 1545 04/24/20 1549  BP:  (!) 136/95  Pulse: 68 61  Resp: (!) 8 (!) 10  Temp:    SpO2: 94% 92%         Shelton Silvas

## 2020-04-24 NOTE — Transfer of Care (Signed)
Immediate Anesthesia Transfer of Care Note  Patient: Jeremiah Williams  Procedure(s) Performed: Procedure(s) (LRB): SPERMATOCELECTOMY (Right)  Patient Location: PACU  Anesthesia Type: General  Level of Consciousness: awake, sedated, patient cooperative and responds to stimulation  Airway & Oxygen Therapy: Patient Spontanous Breathing and Patient connected to [place] 02 and soft FM   Post-op Assessment: Report given to PACU RN, Post -op Vital signs reviewed and stable and Patient moving all extremities  Post vital signs: Reviewed and stable  Complications: No apparent anesthesia complications

## 2020-04-24 NOTE — Op Note (Signed)
Operative Note  Preoperative diagnosis:  1.  3 cm right spermatocele  Postoperative diagnosis: Same  Procedure(s): 1.  Right spermatocele ectomy  Surgeon: Rhoderick Moody, MD  Assistants:  None  Anesthesia:  General  Complications:  None  EBL: 10 mL  Specimens: 1.  Right spermatocele  Drains/Catheters: 1.  Right hemiscrotal Penrose drain  Intraoperative findings:   1. 3 cm spermatocele  Indication:  Jeremiah Williams is a 49 y.o. male with progressive right hemiscrotal enlargement secondary to a spermatocele.  He has been consented for the above procedures, voices understanding and wishes to proceed.  Description of procedure:  After informed consent was obtained, the patient was brought to the operating room and general anesthesia was administered.  The patient was placed in the supine position and prepped and draped in usual sterile fashion.  A time was then performed.  A 3 cm vertical incision was then made in the midportion of the scrotum along the median raphae.  The overlying cremasteric fibers and the dartos fascia was incised using electrocautery until the spermatocele sac was clearly delineated.  The right testicle was then expressed through the scrotal incision.  The spermatocele sac was then carefully dissected away from the lateral aspects of the epididymis using accommodation of blunt dissection and electrocautery.  Once the right spermatocele was free, sent off for permanent section.  The redundant tunica vaginalis was then excised using electrocautery.   A stab incision was then made in the dependent portion of the right hemiscrotum and 1/4 inch Penrose drain was placed within the right hemiscrotal cavity and secured with a 2-0 nylon.  The right testicle was then placed back into the right hemiscrotum, in the correct anatomic orientation.  The scrotal incision was then closed in 2 layers and dressed with Dermabond.  Scrotal fluffs and a jockstrap were then applied.   The patient tolerated the procedure well and was transferred to the postanesthesia in stable condition.   Plan: Follow-up on 8-21 for Penrose drain removal

## 2020-04-24 NOTE — H&P (Signed)
Urology Preoperative H&P   Chief Complaint: Right scrotal enlargement  History of Present Illness: Jeremiah Williams is a 49 y.o. male, previously followed by Dr. Vernie Ammons, with with an enlarging right-sided spermatocele/epididymal cyst.  He notes that over the past year, the right hemiscrotal fluid collection has increased in size and is somewhat prohibiting his physical activity and is moderately painful.  He is here today for surgical excision of the right hemiscrotal fluid collection.    Past Medical History:  Diagnosis Date  . Allergic rhinitis   . Cough variant asthma    pulmonology--- Rubye Oaks NP (notest in epic)---- chronic cough  (per pt exercise induced and allergies)  . GERD (gastroesophageal reflux disease)   . Osteoarthritis    right shoulder  . Spermatocele   . Wears glasses     Past Surgical History:  Procedure Laterality Date  . INGUINAL HERNIA REPAIR Bilateral child and 1998  . LAPAROSCOPIC CHOLECYSTECTOMY  2013 approx.   AND ABDOMINAL HERNIA REPAIR  . SHOULDER ARTHROSCOPY WITH DISTAL CLAVICLE RESECTION Right 09/10/2015   Procedure: RIGHT SHOULDER ARTHROSCOPY DEBRIDEMENT,ACROMIOPLASTY WITH DISTAL CLAVICLE  EXCISION DEBRIDE LABRUM;  Surgeon: Loreta Ave, MD;  Location: [facility];  Service: Orthopedics;  Laterality: Right;    Allergies: No Known Allergies  History reviewed. No pertinent family history.  Social History:  reports that he has never smoked. He has never used smokeless tobacco. He reports current alcohol use. He reports that he does not use drugs.  ROS: A complete review of systems was performed.  All systems are negative except for pertinent findings as noted.  Physical Exam:  Vital signs in last 24 hours: Temp:  [98.7 F (37.1 C)] 98.7 F (37.1 C) (07/30 1242) Pulse Rate:  [59] 59 (07/30 1242) Resp:  [18] 18 (07/30 1242) BP: (123)/(96) 123/96 (07/30 1242) SpO2:  [98 %] 98 % (07/30 1242) Weight:  [108.6 kg] 108.6 kg  (07/30 1242) Constitutional:  Alert and oriented, No acute distress Cardiovascular: Regular rate and rhythm, No JVD Respiratory: Normal respiratory effort, Lungs clear bilaterally GI: Abdomen is soft, nontender, nondistended, no abdominal masses GU: No CVA tenderness.  Right spermatocele/epididymal cyst Lymphatic: No lymphadenopathy Neurologic: Grossly intact, no focal deficits Psychiatric: Normal mood and affect  Laboratory Data:  No results for input(s): WBC, HGB, HCT, PLT in the last 72 hours.  No results for input(s): NA, K, CL, GLUCOSE, BUN, CALCIUM, CREATININE in the last 72 hours.  Invalid input(s): CO3   No results found for this or any previous visit (from the past 24 hour(s)). Recent Results (from the past 240 hour(s))  SARS CORONAVIRUS 2 (TAT 6-24 HRS) Nasopharyngeal Nasopharyngeal Swab     Status: None   Collection Time: 04/21/20 11:21 AM   Specimen: Nasopharyngeal Swab  Result Value Ref Range Status   SARS Coronavirus 2 NEGATIVE NEGATIVE Final    Comment: (NOTE) SARS-CoV-2 target nucleic acids are NOT DETECTED.  The SARS-CoV-2 RNA is generally detectable in upper and lower respiratory specimens during the acute phase of infection. Negative results do not preclude SARS-CoV-2 infection, do not rule out co-infections with other pathogens, and should not be used as the sole basis for treatment or other patient management decisions. Negative results must be combined with clinical observations, patient history, and epidemiological information. The expected result is Negative.  Fact Sheet for Patients: HairSlick.no  Fact Sheet for Healthcare Providers: quierodirigir.com  This test is not yet approved or cleared by the [place] FDA and  has been  authorized for detection and/or diagnosis of SARS-CoV-2 by FDA under an Emergency Use Authorization (EUA). This EUA will remain  in effect (meaning this test can be  used) for the duration of the COVID-19 declaration under Se ction 564(b)(1) of the Act, 21 U.S.C. section 360bbb-3(b)(1), unless the authorization is terminated or revoked sooner.  Performed at [facility], [address], [place], [place] [address]     Renal Function: No results for input(s): CREATININE in the last 168 hours. CrCl cannot be calculated (Patient's most recent lab result is older than the maximum 21 days allowed.).  Radiologic Imaging: No results found.  I independently reviewed the above imaging studies.  Assessment and Plan Jeremiah Williams is a 49 y.o. male with a right sided spermatocele  -The risks, benefits and alternatives of right spermatocele ectomy was discussed with the patient.  Risks include but are not limited bleeding complications, wound infection, chronic testicular pain, recurrence of the spermatocele, the need for scrotal drain, testicular loss and MI, CVA, DVT, PE and the inherent risks associated with general anesthesia. He voices understanding and wishes to proceed.   Rhoderick Moody, MD 04/24/2020, 1:51 PM  [facility] Pager: [phone]

## 2020-04-24 NOTE — Anesthesia Procedure Notes (Signed)
Procedure Name: LMA Insertion Date/Time: 04/24/2020 2:06 PM Performed by: Pearson Grippe, CRNA Pre-anesthesia Checklist: Patient identified, Emergency Drugs available, Suction available and Patient being monitored Patient Re-evaluated:Patient Re-evaluated prior to induction Oxygen Delivery Method: Circle system utilized Preoxygenation: Pre-oxygenation with 100% oxygen Induction Type: IV induction Ventilation: Mask ventilation without difficulty LMA: LMA inserted LMA Size: 5.0 Number of attempts: 1 Airway Equipment and Method: Bite block Placement Confirmation: positive ETCO2 Tube secured with: Tape Dental Injury: Teeth and Oropharynx as per pre-operative assessment

## 2020-04-24 NOTE — Discharge Instructions (Signed)
  Post Anesthesia Home Care Instructions  Activity: Get plenty of rest for the remainder of the day. A responsible individual must stay with you for 24 hours following the procedure.  For the next 24 hours, DO NOT: -Drive a car -Advertising copywriter -Drink alcoholic beverages -Take any medication unless instructed by your physician -Make any legal decisions or sign important papers.  Meals: Start with liquid foods such as gelatin or soup. Progress to regular foods as tolerated. Avoid greasy, spicy, heavy foods. If nausea and/or vomiting occur, drink only clear liquids until the nausea and/or vomiting subsides. Call your physician if vomiting continues.  Special Instructions/Symptoms: Your throat may feel dry or sore from the anesthesia or the breathing tube placed in your throat during surgery. If this causes discomfort, gargle with warm salt water. The discomfort should disappear within 24 hours.  No additional Tylenol or Acetaminophen until after 6:00 pm today.

## 2020-04-27 ENCOUNTER — Encounter (HOSPITAL_BASED_OUTPATIENT_CLINIC_OR_DEPARTMENT_OTHER): Payer: Self-pay | Admitting: Urology

## 2020-04-27 LAB — SURGICAL PATHOLOGY

## 2020-10-30 ENCOUNTER — Other Ambulatory Visit: Payer: Self-pay | Admitting: Adult Health

## 2020-10-30 DIAGNOSIS — J45991 Cough variant asthma: Secondary | ICD-10-CM

## 2020-11-01 ENCOUNTER — Other Ambulatory Visit: Payer: Self-pay | Admitting: Adult Health

## 2020-11-01 DIAGNOSIS — J45991 Cough variant asthma: Secondary | ICD-10-CM

## 2021-04-28 IMAGING — CT CT NECK WITH CONTRAST
1 of 5 series · 2 of 14 positions shown, 3 images · IV contrast (iopamidol)
Comparison: None

CLINICAL DATA: Right paramedian submandibular swelling for 2 weeks

EXAM:
CT NECK WITH CONTRAST
TECHNIQUE: Multidetector CT imaging of the neck was performed using the
standard protocol following the bolus administration of intravenous
contrast.
CONTRAST:  75mL HSXBUR-688 IOPAMIDOL (HSXBUR-688) INJECTION 61%

[Series 3: neck · axial · 0.50mm/px · z∈[-259,-177]mm · 2 of 123 slices shown, 3 images]
[im 41/123  soft-tissue]
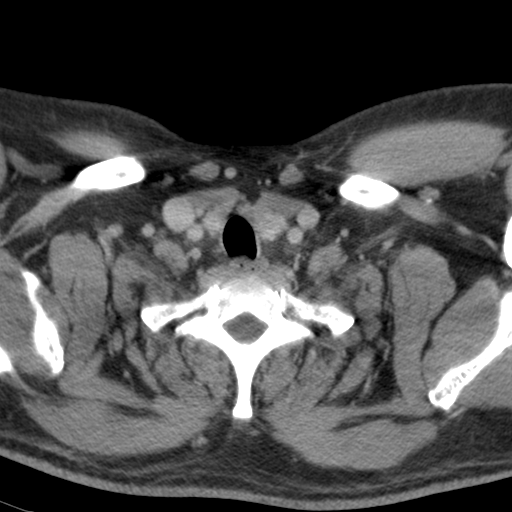
[im 41/123  bone]
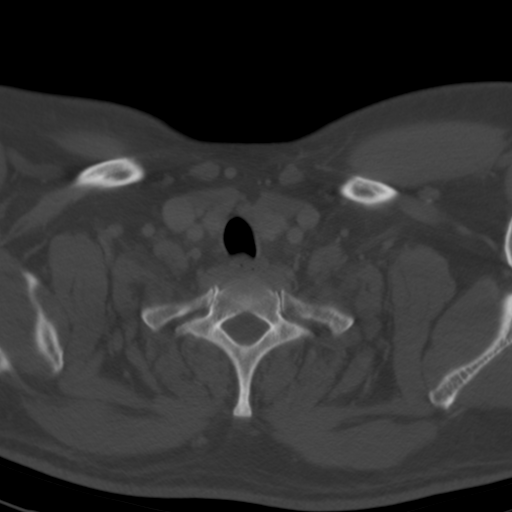
[im 82/123  bone]
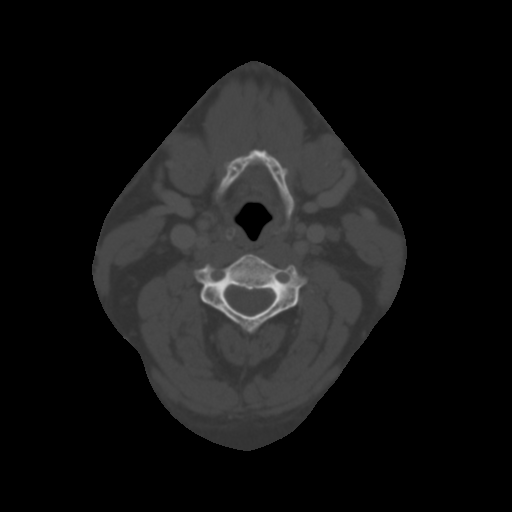

[2 of 14 positions shown; findings below may reference images not displayed]

FINDINGS: Pharynx and larynx: Normal. No mass or swelling.

Salivary glands: No inflammation, mass, or stone.

Thyroid: Normal.

Lymph nodes: None enlarged or abnormal density.

Vascular: Negative.

Limited intracranial: Negative.

Visualized orbits: Negative.

Mastoids and visualized paranasal sinuses: Clear.

Skeleton: No acute or aggressive process.

Upper chest: Negative.

Other: Palpable marker over the midline thyroid cartilage. Just
superior to this area there is a small left paramedian cleft in the
hyoid bone with an adjacent soft tissue nodule measuring 9 mm. This
is most likely a thyroglossal duct remnant based on location. Just
inferior, at the level of the upper strap muscles, is a high-density
nodule most consistent with small ectopic thyroid.
IMPRESSION: 9 mm nodule along a small cleft in the left para median hyoid bone,
probable small thyroglossal duct remnant. Just inferior is a small
focus of ectopic thyroid.

## 2022-11-15 ENCOUNTER — Encounter: Payer: Self-pay | Admitting: Gastroenterology

## 2023-01-17 ENCOUNTER — Ambulatory Visit: Payer: 59 | Admitting: Emergency Medicine

## 2023-01-17 ENCOUNTER — Encounter: Payer: Self-pay | Admitting: Emergency Medicine

## 2023-01-17 VITALS — BP 134/86 | HR 66 | Temp 98.1°F | Ht 64.0 in | Wt 257.0 lb

## 2023-01-17 DIAGNOSIS — Z0000 Encounter for general adult medical examination without abnormal findings: Secondary | ICD-10-CM

## 2023-01-17 DIAGNOSIS — Z125 Encounter for screening for malignant neoplasm of prostate: Secondary | ICD-10-CM

## 2023-01-17 DIAGNOSIS — Z130 Encounter for screening for diseases of the blood and blood-forming organs and certain disorders involving the immune mechanism: Secondary | ICD-10-CM | POA: Diagnosis not present

## 2023-01-17 DIAGNOSIS — Z1211 Encounter for screening for malignant neoplasm of colon: Secondary | ICD-10-CM

## 2023-01-17 DIAGNOSIS — Z13228 Encounter for screening for other metabolic disorders: Secondary | ICD-10-CM

## 2023-01-17 DIAGNOSIS — Z114 Encounter for screening for human immunodeficiency virus [HIV]: Secondary | ICD-10-CM | POA: Diagnosis not present

## 2023-01-17 DIAGNOSIS — Z13220 Encounter for screening for lipoid disorders: Secondary | ICD-10-CM

## 2023-01-17 DIAGNOSIS — Z1329 Encounter for screening for other suspected endocrine disorder: Secondary | ICD-10-CM

## 2023-01-17 DIAGNOSIS — Z1159 Encounter for screening for other viral diseases: Secondary | ICD-10-CM

## 2023-01-17 LAB — CBC WITH DIFFERENTIAL/PLATELET
Basophils Absolute: 0 10*3/uL (ref 0.0–0.1)
Basophils Relative: 0.6 % (ref 0.0–3.0)
Eosinophils Absolute: 0.1 10*3/uL (ref 0.0–0.7)
Eosinophils Relative: 1.8 % (ref 0.0–5.0)
HCT: 46.4 % (ref 39.0–52.0)
Hemoglobin: 16.3 g/dL (ref 13.0–17.0)
Lymphocytes Relative: 25.8 % (ref 12.0–46.0)
Lymphs Abs: 1.7 10*3/uL (ref 0.7–4.0)
MCHC: 35.1 g/dL (ref 30.0–36.0)
MCV: 95.9 fl (ref 78.0–100.0)
Monocytes Absolute: 0.6 10*3/uL (ref 0.1–1.0)
Monocytes Relative: 9.6 % (ref 3.0–12.0)
Neutro Abs: 4 10*3/uL (ref 1.4–7.7)
Neutrophils Relative %: 62.2 % (ref 43.0–77.0)
Platelets: 272 10*3/uL (ref 150.0–400.0)
RBC: 4.83 Mil/uL (ref 4.22–5.81)
RDW: 13.6 % (ref 11.5–15.5)
WBC: 6.4 10*3/uL (ref 4.0–10.5)

## 2023-01-17 LAB — COMPREHENSIVE METABOLIC PANEL
ALT: 27 U/L (ref 0–53)
AST: 17 U/L (ref 0–37)
Albumin: 3.8 g/dL (ref 3.5–5.2)
Alkaline Phosphatase: 60 U/L (ref 39–117)
BUN: 7 mg/dL (ref 6–23)
CO2: 30 mEq/L (ref 19–32)
Calcium: 8.7 mg/dL (ref 8.4–10.5)
Chloride: 102 mEq/L (ref 96–112)
Creatinine, Ser: 0.93 mg/dL (ref 0.40–1.50)
GFR: 95.96 mL/min (ref >60.00)
Glucose, Bld: 108 mg/dL — ABNORMAL HIGH (ref 70–99)
Potassium: 4.3 mEq/L (ref 3.5–5.1)
Sodium: 138 mEq/L (ref 135–145)
Total Bilirubin: 0.5 mg/dL (ref 0.2–1.2)
Total Protein: 6.3 g/dL (ref 6.0–8.3)

## 2023-01-17 LAB — LIPID PANEL
Cholesterol: 191 mg/dL (ref 0–200)
HDL: 44.6 mg/dL (ref >39.00)
LDL Cholesterol: 127 mg/dL — ABNORMAL HIGH (ref 0–99)
NonHDL: 146.13
Total CHOL/HDL Ratio: 4
Triglycerides: 94 mg/dL (ref 0.0–149.0)
VLDL: 18.8 mg/dL (ref 0.0–40.0)

## 2023-01-17 LAB — HEMOGLOBIN A1C: Hgb A1c MFr Bld: 5.5 % (ref 4.6–6.5)

## 2023-01-17 LAB — PSA: PSA: 0.87 ng/mL (ref 0.10–4.00)

## 2023-01-17 NOTE — Progress Notes (Signed)
Jeremiah Williams 52 y.o.   Chief Complaint  Patient presents with   New Patient (Initial Visit)    No concerns     HISTORY OF PRESENT ILLNESS: This is a 52 y.o. male first visit to this office, here to establish care with me. Has no complaints or medical concerns today.  HPI   Prior to Admission medications   Medication Sig Start Date End Date Taking? Authorizing Provider  albuterol (PROAIR HFA) 108 (90 Base) MCG/ACT inhaler 2 puffs every 4 hours as needed only  if your can't catch your breath 05/08/18  Yes Nyoka Cowden, MD  budesonide-formoterol (SYMBICORT) 80-4.5 MCG/ACT inhaler Inhale 2 puffs into the lungs 2 (two) times daily. 11/20/17  Yes Nyoka Cowden, MD  chlorpheniramine (CHLOR-TRIMETON) 4 MG tablet Take 4 mg by mouth every 4 (four) hours as needed for allergies.   Yes [provider]  pantoprazole (PROTONIX) 40 MG tablet TAKE 1 TABLET BY MOUTH EVERY DAY 30 TO 60 MINUTES BEFORE FIRST MEAL OF THE DAY Patient taking differently: Take 40 mg by mouth daily. TAKE 1 TABLET BY MOUTH EVERY DAY 30 TO 60 MINUTES BEFORE FIRST MEAL OF THE DAY 10/10/19  Yes Parrett, Tammy S, NP  famotidine (PEPCID) 20 MG tablet TAKE 1 TABLET BY MOUTH EVERY EVENING AT BEDTIME Patient not taking: Reported on 01/17/2023 10/10/19   Parrett, Virgel Bouquet, NP  montelukast (SINGULAIR) 10 MG tablet TAKE 1 TABLET BY MOUTH EVERYDAY AT BEDTIME Patient not taking: Reported on 01/17/2023 10/10/19   Julio Sicks, NP    No Known Allergies  Patient Active Problem List   Diagnosis Date Noted   Morbid obesity due to excess calories 11/07/2017   Cough variant asthma vs UACS from gerd or pnds 12/08/2008   FATTY LIVER DISEASE 07/17/2008    Past Medical History:  Diagnosis Date   Allergic rhinitis    Cough variant asthma    pulmonology--- Rubye Oaks NP (notest in epic)---- chronic cough  (per pt exercise induced and allergies)   GERD (gastroesophageal reflux disease)    Osteoarthritis    right shoulder    Spermatocele    Wears glasses     Past Surgical History:  Procedure Laterality Date   INGUINAL HERNIA REPAIR Bilateral child and 1998   LAPAROSCOPIC CHOLECYSTECTOMY  2013 approx.   AND ABDOMINAL HERNIA REPAIR   SHOULDER ARTHROSCOPY WITH DISTAL CLAVICLE RESECTION Right 09/10/2015   Procedure: RIGHT SHOULDER ARTHROSCOPY DEBRIDEMENT,ACROMIOPLASTY WITH DISTAL CLAVICLE  EXCISION DEBRIDE LABRUM;  Surgeon: Loreta Ave, MD;  Location: [facility];  Service: Orthopedics;  Laterality: Right;   SPERMATOCELECTOMY Right 04/24/2020   Procedure: SPERMATOCELECTOMY;  Surgeon: Rene Paci, MD;  Location: [facility];  Service: Urology;  Laterality: Right;    Social History   Socioeconomic History   Marital status: Married    Spouse name: Not on file   Number of children: Not on file   Years of education: Not on file   Highest education level: Not on file  Occupational History   Not on file  Tobacco Use   Smoking status: Never   Smokeless tobacco: Never  Vaping Use   Vaping Use: Never used  Substance and Sexual Activity   Alcohol use: Yes    Comment: social   Drug use: Never   Sexual activity: Not on file  Other Topics Concern   Not on file  Social History Narrative   Not on file   Social Determinants of Health  Financial Resource Strain: Not on file  Food Insecurity: Not on file  Transportation Needs: Not on file  Physical Activity: Not on file  Stress: Not on file  Social Connections: Not on file  Intimate Partner Violence: Not on file    No family history on file.   Review of Systems  Constitutional: Negative.   HENT: Negative.  Negative for congestion and sore throat.   Respiratory: Negative.  Negative for cough and shortness of breath.   Cardiovascular: Negative.  Negative for chest pain and palpitations.  Gastrointestinal:  Negative for abdominal pain, diarrhea, nausea and vomiting.  Genitourinary: Negative.  Negative for  dysuria and hematuria.  Skin: Negative.  Negative for rash.  Neurological: Negative.  Negative for dizziness and headaches.  All other systems reviewed and are negative.   Vitals:   01/17/23 1015  BP: 134/86  Pulse: 66  Temp: 98.1 F (36.7 C)  SpO2: 96%    Physical Exam Vitals reviewed.  Constitutional:      Appearance: Normal appearance. He is obese.  HENT:     Head: Normocephalic.     Mouth/Throat:     Mouth: Mucous membranes are moist.     Pharynx: Oropharynx is clear.  Eyes:     Extraocular Movements: Extraocular movements intact.     Conjunctiva/sclera: Conjunctivae normal.     Pupils: Pupils are equal, round, and reactive to light.  Cardiovascular:     Rate and Rhythm: Normal rate and regular rhythm.     Pulses: Normal pulses.     Heart sounds: Normal heart sounds.  Pulmonary:     Effort: Pulmonary effort is normal.     Breath sounds: Normal breath sounds.  Abdominal:     Palpations: Abdomen is soft.     Tenderness: There is no abdominal tenderness.  Musculoskeletal:     Cervical back: No tenderness.  Lymphadenopathy:     Cervical: No cervical adenopathy.  Skin:    General: Skin is warm and dry.     Capillary Refill: Capillary refill takes less than 2 seconds.  Neurological:     General: No focal deficit present.     Mental Status: He is alert and oriented to person, place, and time.  Psychiatric:        Mood and Affect: Mood normal.        Behavior: Behavior normal.      ASSESSMENT & PLAN: Problem List Items Addressed This Visit       Other   Morbid obesity due to excess calories   Other Visit Diagnoses     Routine general medical examination at a health care facility    -  Primary   Relevant Orders   CBC with Differential   Comprehensive metabolic panel   Hemoglobin A1c   Lipid panel   PSA(Must document that pt has been informed of limitations of PSA testing.)   Hepatitis C antibody screen   HIV antibody   Prostate cancer screening        Relevant Orders   PSA(Must document that pt has been informed of limitations of PSA testing.)   Need for hepatitis C screening test       Relevant Orders   Hepatitis C antibody screen   Screening for HIV (human immunodeficiency virus)       Relevant Orders   HIV antibody   Colon cancer screening       Relevant Orders   Ambulatory referral to Gastroenterology   Screening for deficiency anemia  Relevant Orders   CBC with Differential   Screening for lipoid disorders       Relevant Orders   Lipid panel   Screening for endocrine, metabolic and immunity disorder       Relevant Orders   Comprehensive metabolic panel   Hemoglobin A1c      Modifiable risk factors discussed with patient. Anticipatory guidance according to age provided. The following topics were also discussed: Social Determinants of Health Smoking.  Non-smoker Diet and nutrition and need to decrease amount of daily carbohydrate intake and daily calories and increase amount of plant-based protein in his diet Benefits of exercise Cancer family history review on need for colon cancer screening with colonoscopy Vaccinations reviewed recommendations Cardiovascular risk assessment and need for blood work. Mental health including depression and anxiety Fall and accident prevention  Patient Instructions  Health Maintenance, Male Adopting a healthy lifestyle and getting preventive care are important in promoting health and wellness. Ask your health care provider about: The right schedule for you to have regular tests and exams. Things you can do on your own to prevent diseases and keep yourself healthy. What should I know about diet, weight, and exercise? Eat a healthy diet  Eat a diet that includes plenty of vegetables, fruits, low-fat dairy products, and lean protein. Do not eat a lot of foods that are high in solid fats, added sugars, or sodium. Maintain a healthy weight Body mass index (BMI) is a measurement  that can be used to identify possible weight problems. It estimates body fat based on height and weight. Your health care provider can help determine your BMI and help you achieve or maintain a healthy weight. Get regular exercise Get regular exercise. This is one of the most important things you can do for your health. Most adults should: Exercise for at least 150 minutes each week. The exercise should increase your heart rate and make you sweat (moderate-intensity exercise). Do strengthening exercises at least twice a week. This is in addition to the moderate-intensity exercise. Spend less time sitting. Even light physical activity can be beneficial. Watch cholesterol and blood lipids Have your blood tested for lipids and cholesterol at 50 years of age, then have this test every 5 years. You may need to have your cholesterol levels checked more often if: Your lipid or cholesterol levels are high. You are older than 50 years of age. You are at high risk for heart disease. What should I know about cancer screening? Many types of cancers can be detected early and may often be prevented. Depending on your health history and family history, you may need to have cancer screening at various ages. This may include screening for: Colorectal cancer. Prostate cancer. Skin cancer. Lung cancer. What should I know about heart disease, diabetes, and high blood pressure? Blood pressure and heart disease High blood pressure causes heart disease and increases the risk of stroke. This is more likely to develop in people who have high blood pressure readings or are overweight. Talk with your health care provider about your target blood pressure readings. Have your blood pressure checked: Every 3-5 years if you are 57-37 years of age. Every year if you are 59 years old or older. If you are between the ages of 42 and 47 and are a current or former smoker, ask your health care provider if you should have a  one-time screening for abdominal aortic aneurysm (AAA). Diabetes Have regular diabetes screenings. This checks your fasting blood sugar level.  Have the screening done: Once every three years after age 22 if you are at a normal weight and have a low risk for diabetes. More often and at a younger age if you are overweight or have a high risk for diabetes. What should I know about preventing infection? Hepatitis B If you have a higher risk for hepatitis B, you should be screened for this virus. Talk with your health care provider to find out if you are at risk for hepatitis B infection. Hepatitis C Blood testing is recommended for: Everyone born from [birth year] through 1963. Anyone with known risk factors for hepatitis C. Sexually transmitted infections (STIs) You should be screened each year for STIs, including gonorrhea and chlamydia, if: You are sexually active and are younger than 50 years of age. You are older than 50 years of age and your health care provider tells you that you are at risk for this type of infection. Your sexual activity has changed since you were last screened, and you are at increased risk for chlamydia or gonorrhea. Ask your health care provider if you are at risk. Ask your health care provider about whether you are at high risk for HIV. Your health care provider may recommend a prescription medicine to help prevent HIV infection. If you choose to take medicine to prevent HIV, you should first get tested for HIV. You should then be tested every 3 months for as long as you are taking the medicine. Follow these instructions at home: Alcohol use Do not drink alcohol if your health care provider tells you not to drink. If you drink alcohol: Limit how much you have to 0-2 drinks a day. Know how much alcohol is in your drink. In the [place] one drink equals one 12 oz bottle of beer (355 mL), one 5 oz glass of wine (148 mL), or one 1 oz glass of hard liquor (44 mL). Lifestyle Do not  use any products that contain nicotine or tobacco. These products include cigarettes, chewing tobacco, and vaping devices, such as e-cigarettes. If you need help quitting, ask your health care provider. Do not use street drugs. Do not share needles. Ask your health care provider for help if you need support or information about quitting drugs. General instructions Schedule regular health, dental, and eye exams. Stay current with your vaccines. Tell your health care provider if: You often feel depressed. You have ever been abused or do not feel safe at home. Summary Adopting a healthy lifestyle and getting preventive care are important in promoting health and wellness. Follow your health care provider's instructions about healthy diet, exercising, and getting tested or screened for diseases. Follow your health care provider's instructions on monitoring your cholesterol and blood pressure. This information is not intended to replace advice given to you by your health care provider. Make sure you discuss any questions you have with your health care provider. Document Revised: 02/01/2021 Document Reviewed: 02/01/2021 Elsevier Patient Education  2023 [employer]vier Inc.     Edwina Barth, MD [facility]

## 2023-01-17 NOTE — Patient Instructions (Signed)
Health Maintenance, Male Adopting a healthy lifestyle and getting preventive care are important in promoting health and wellness. Ask your health care provider about: The right schedule for you to have regular tests and exams. Things you can do on your own to prevent diseases and keep yourself healthy. What should I know about diet, weight, and exercise? Eat a healthy diet  Eat a diet that includes plenty of vegetables, fruits, low-fat dairy products, and lean protein. Do not eat a lot of foods that are high in solid fats, added sugars, or sodium. Maintain a healthy weight Body mass index (BMI) is a measurement that can be used to identify possible weight problems. It estimates body fat based on height and weight. Your health care provider can help determine your BMI and help you achieve or maintain a healthy weight. Get regular exercise Get regular exercise. This is one of the most important things you can do for your health. Most adults should: Exercise for at least 150 minutes each week. The exercise should increase your heart rate and make you sweat (moderate-intensity exercise). Do strengthening exercises at least twice a week. This is in addition to the moderate-intensity exercise. Spend less time sitting. Even light physical activity can be beneficial. Watch cholesterol and blood lipids Have your blood tested for lipids and cholesterol at 50 years of age, then have this test every 5 years. You may need to have your cholesterol levels checked more often if: Your lipid or cholesterol levels are high. You are older than 50 years of age. You are at high risk for heart disease. What should I know about cancer screening? Many types of cancers can be detected early and may often be prevented. Depending on your health history and family history, you may need to have cancer screening at various ages. This may include screening for: Colorectal cancer. Prostate cancer. Skin cancer. Lung  cancer. What should I know about heart disease, diabetes, and high blood pressure? Blood pressure and heart disease High blood pressure causes heart disease and increases the risk of stroke. This is more likely to develop in people who have high blood pressure readings or are overweight. Talk with your health care provider about your target blood pressure readings. Have your blood pressure checked: Every 3-5 years if you are 18-39 years of age. Every year if you are 40 years old or older. If you are between the ages of 65 and 75 and are a current or former smoker, ask your health care provider if you should have a one-time screening for abdominal aortic aneurysm (AAA). Diabetes Have regular diabetes screenings. This checks your fasting blood sugar level. Have the screening done: Once every three years after age 45 if you are at a normal weight and have a low risk for diabetes. More often and at a younger age if you are overweight or have a high risk for diabetes. What should I know about preventing infection? Hepatitis B If you have a higher risk for hepatitis B, you should be screened for this virus. Talk with your health care provider to find out if you are at risk for hepatitis B infection. Hepatitis C Blood testing is recommended for: Everyone born from 1945 through 1965. Anyone with known risk factors for hepatitis C. Sexually transmitted infections (STIs) You should be screened each year for STIs, including gonorrhea and chlamydia, if: You are sexually active and are younger than 50 years of age. You are older than 50 years of age and your   health care provider tells you that you are at risk for this type of infection. Your sexual activity has changed since you were last screened, and you are at increased risk for chlamydia or gonorrhea. Ask your health care provider if you are at risk. Ask your health care provider about whether you are at high risk for HIV. Your health care provider  may recommend a prescription medicine to help prevent HIV infection. If you choose to take medicine to prevent HIV, you should first get tested for HIV. You should then be tested every 3 months for as long as you are taking the medicine. Follow these instructions at home: Alcohol use Do not drink alcohol if your health care provider tells you not to drink. If you drink alcohol: Limit how much you have to 0-2 drinks a day. Know how much alcohol is in your drink. In the [place] one drink equals one 12 oz bottle of beer (355 mL), one 5 oz glass of wine (148 mL), or one 1 oz glass of hard liquor (44 mL). Lifestyle Do not use any products that contain nicotine or tobacco. These products include cigarettes, chewing tobacco, and vaping devices, such as e-cigarettes. If you need help quitting, ask your health care provider. Do not use street drugs. Do not share needles. Ask your health care provider for help if you need support or information about quitting drugs. General instructions Schedule regular health, dental, and eye exams. Stay current with your vaccines. Tell your health care provider if: You often feel depressed. You have ever been abused or do not feel safe at home. Summary Adopting a healthy lifestyle and getting preventive care are important in promoting health and wellness. Follow your health care provider's instructions about healthy diet, exercising, and getting tested or screened for diseases. Follow your health care provider's instructions on monitoring your cholesterol and blood pressure. This information is not intended to replace advice given to you by your health care provider. Make sure you discuss any questions you have with your health care provider. Document Revised: 02/01/2021 Document Reviewed: 02/01/2021 Elsevier Patient Education  2023 Elsevier Inc.  

## 2023-01-18 LAB — HEPATITIS C ANTIBODY: Hepatitis C Ab: NONREACTIVE

## 2023-01-18 LAB — HIV ANTIBODY (ROUTINE TESTING W REFLEX): HIV 1&2 Ab, 4th Generation: NONREACTIVE

## 2023-03-29 ENCOUNTER — Ambulatory Visit (AMBULATORY_SURGERY_CENTER): Payer: 59

## 2023-03-29 ENCOUNTER — Encounter: Payer: Self-pay | Admitting: Gastroenterology

## 2023-03-29 VITALS — Ht 64.0 in | Wt 227.0 lb

## 2023-03-29 DIAGNOSIS — Z1211 Encounter for screening for malignant neoplasm of colon: Secondary | ICD-10-CM

## 2023-03-29 MED ORDER — NA SULFATE-K SULFATE-MG SULF 17.5-3.13-1.6 GM/177ML PO SOLN: 1.0000 | Freq: Once | ORAL | 0 refills | Status: AC

## 2023-03-29 NOTE — Progress Notes (Signed)
Pre visit completed via phone call; Patient verified name, DOB, and address;  No egg or soy allergy known to patient;  No issues known to pt with past sedation with any surgeries or procedures; Patient denies ever being told they had issues or difficulty with intubation;  No FH of Malignant Hyperthermia; Pt is not on diet pills; Pt is not on home 02;  Pt is not on blood thinners; Pt denies issues with constipation;  No A fib or A flutter; Have any cardiac testing pending--NO Pt instructed to use Singlecare[employer]com or GoodRx for a price reduction on prep;   Insurance verified during PV appt=[employer]  Patient's chart reviewed by Cathlyn Parsons CNRA prior to previsit and patient appropriate for the [facility].  Previsit completed and red dot placed by patient's name on their procedure day (on provider's schedule).    Instructions sent to patient via MyChart per his request;

## 2023-04-24 ENCOUNTER — Ambulatory Visit: Payer: 59 | Admitting: Gastroenterology

## 2023-04-24 ENCOUNTER — Encounter: Payer: Self-pay | Admitting: Gastroenterology

## 2023-04-24 VITALS — BP 149/99 | HR 72 | Temp 98.8°F | Resp 12 | Ht 64.0 in | Wt 225.0 lb

## 2023-04-24 DIAGNOSIS — Z1211 Encounter for screening for malignant neoplasm of colon: Secondary | ICD-10-CM | POA: Diagnosis present

## 2023-04-24 DIAGNOSIS — D123 Benign neoplasm of transverse colon: Secondary | ICD-10-CM

## 2023-04-24 DIAGNOSIS — D124 Benign neoplasm of descending colon: Secondary | ICD-10-CM

## 2023-04-24 DIAGNOSIS — D122 Benign neoplasm of ascending colon: Secondary | ICD-10-CM

## 2023-04-24 HISTORY — PX: COLONOSCOPY: SHX174

## 2023-04-24 MED ORDER — SODIUM CHLORIDE 0.9 % IV SOLN
500.0000 mL | Freq: Once | INTRAVENOUS | Status: DC
Start: 2023-04-24 — End: 2023-04-24

## 2023-04-24 NOTE — Patient Instructions (Signed)

## 2023-04-24 NOTE — Op Note (Signed)
[facility] Patient Name: Jeremiah Williams Procedure Date: 04/24/2023 7:59 AM MRN: [id number] Endoscopist: Meryl Dare , MD, [phone] Age: 52 Referring MD:  Date of Birth: [birth year] Gender: Male Account #: [id number] Procedure:                Colonoscopy Indications:              Screening for colorectal malignant neoplasm Medicines:                Monitored Anesthesia Care Procedure:                Pre-Anesthesia Assessment:                           - Prior to the procedure, a History and Physical                            was performed, and patient medications and                            allergies were reviewed. The patient's tolerance of                            previous anesthesia was also reviewed. The risks                            and benefits of the procedure and the sedation                            options and risks were discussed with the patient.                            All questions were answered, and informed consent                            was obtained. Prior Anticoagulants: The patient has                            taken no anticoagulant or antiplatelet agents. ASA                            Grade Assessment: II - A patient with mild systemic                            disease. After reviewing the risks and benefits,                            the patient was deemed in satisfactory condition to                            undergo the procedure.                           After obtaining informed consent, the colonoscope  was passed under direct vision. Throughout the                            procedure, the patient's blood pressure, pulse, and                            oxygen saturations were monitored continuously. The                            CF [id number] #[id number] was introduced through the anus                            and advanced to the the cecum, identified by                            appendiceal  orifice and ileocecal valve. The                            ileocecal valve, appendiceal orifice, and rectum                            were photographed. The quality of the bowel                            preparation was good. The colonoscopy was performed                            without difficulty. The patient tolerated the                            procedure well. Scope In: 8:07:20 AM Scope Out: 8:20:46 AM Scope Withdrawal Time: 0 hours 11 minutes 42 seconds  Total Procedure Duration: 0 hours 13 minutes 26 seconds  Findings:                 The perianal and digital rectal examinations were                            normal.                           Three sessile polyps were found in the descending                            colon, transverse colon and ascending colon. The                            polyps were 4 to 10 mm in size. These polyps were                            removed with a cold snare. Resection and retrieval                            were complete.  A few small-mouthed diverticula were found in the                            left colon. There was no evidence of diverticular                            bleeding.                           The exam was otherwise without abnormality on                            direct and retroflexion views. Complications:            No immediate complications. Estimated blood loss:                            None. Estimated Blood Loss:     Estimated blood loss: none. Impression:               - Three 4 to 10 mm polyps in the descending colon,                            in the transverse colon and in the ascending colon,                            removed with a cold snare. Resected and retrieved.                           - Mild diverticulosis in the left colon.                           - The examination was otherwise normal on direct                            and retroflexion views. Recommendation:            - Repeat colonoscopy after studies are complete for                            surveillance based on pathology results.                           - Patient has a contact number available for                            emergencies. The signs and symptoms of potential                            delayed complications were discussed with the                            patient. Return to normal activities tomorrow.                            Written  discharge instructions were provided to the                            patient.                           - Resume previous diet.                           - Continue present medications.                           - Await pathology results. Meryl Dare, MD 04/24/2023 8:24:08 AM This report has been signed electronically.

## 2023-04-24 NOTE — Progress Notes (Signed)
Vss nad trans to pacu 

## 2023-04-24 NOTE — Progress Notes (Signed)
History & Physical  Primary Care Physician:  Georgina Quint, MD Primary Gastroenterologist: Claudette Head, MD  Impression / Plan:  Average risk screening colonoscopy.  CHIEF COMPLAINT:  CRC screening   HPI: Jeremiah Williams is a 52 y.o. male average risk screening for colonoscopy.    Past Medical History:  Diagnosis Date   Allergic rhinitis    Cough variant asthma    pulmonology--- Rubye Oaks NP (notest in epic)---- chronic cough  (per pt exercise induced and allergies)   GERD (gastroesophageal reflux disease)    hx of   Osteoarthritis    right shoulder   Seasonal allergies    Spermatocele    Wears glasses     Past Surgical History:  Procedure Laterality Date   INGUINAL HERNIA REPAIR Bilateral child and 1998   LAPAROSCOPIC CHOLECYSTECTOMY  2013   AND ABDOMINAL HERNIA REPAIR   SHOULDER ARTHROSCOPY WITH DISTAL CLAVICLE RESECTION Right 09/10/2015   Procedure: RIGHT SHOULDER ARTHROSCOPY DEBRIDEMENT,ACROMIOPLASTY WITH DISTAL CLAVICLE  EXCISION DEBRIDE LABRUM;  Surgeon: Loreta Ave, MD;  Location: [facility];  Service: Orthopedics;  Laterality: Right;   SPERMATOCELECTOMY Right 04/24/2020   Procedure: SPERMATOCELECTOMY;  Surgeon: Rene Paci, MD;  Location: [facility];  Service: Urology;  Laterality: Right;    Prior to Admission medications   Medication Sig Start Date End Date Taking? Authorizing Provider  chlorpheniramine (CHLOR-TRIMETON) 4 MG tablet Take 4 mg by mouth 2 (two) times daily as needed for allergies.   Yes [provider]  albuterol (PROAIR HFA) 108 (90 Base) MCG/ACT inhaler 2 puffs every 4 hours as needed only  if your can't catch your breath 05/08/18   Nyoka Cowden, MD    Current Outpatient Medications  Medication Sig Dispense Refill   chlorpheniramine (CHLOR-TRIMETON) 4 MG tablet Take 4 mg by mouth 2 (two) times daily as needed for allergies.     albuterol (PROAIR HFA) 108 (90 Base)  MCG/ACT inhaler 2 puffs every 4 hours as needed only  if your can't catch your breath 1 Inhaler 1   Current Facility-Administered Medications  Medication Dose Route Frequency Provider Last Rate Last Admin   0.9 %  sodium chloride infusion  500 mL Intravenous Once Meryl Dare, MD        Allergies as of 04/24/2023   (No Known Allergies)    Family History  Problem Relation Age of Onset   Cancer Father 56       nasal cavity cancer   Pancreatic cancer Maternal Uncle 39   Pancreatic cancer Maternal Uncle 66    Social History   Socioeconomic History   Marital status: Married    Spouse name: Not on file   Number of children: Not on file   Years of education: Not on file   Highest education level: Not on file  Occupational History   Not on file  Tobacco Use   Smoking status: Never   Smokeless tobacco: Never  Vaping Use   Vaping status: Never Used  Substance and Sexual Activity   Alcohol use: Yes    Alcohol/week: 6.0 standard drinks of alcohol    Types: 6 Standard drinks or equivalent per week    Comment: social   Drug use: Never   Sexual activity: Not on file  Other Topics Concern   Not on file  Social History Narrative   Not on file   Social Determinants of Health   Financial Resource Strain: Not on file  Food  Insecurity: Not on file  Transportation Needs: Not on file  Physical Activity: Not on file  Stress: Not on file  Social Connections: Not on file  Intimate Partner Violence: Not on file    Review of Systems:  All systems reviewed were negative except where noted in HPI.   Physical Exam:  General:  Alert, well-developed, in NAD Head:  Normocephalic and atraumatic. Eyes:  Sclera clear, no icterus.   Conjunctiva pink. Ears:  Normal auditory acuity. Mouth:  No deformity or lesions.  Neck:  Supple; no masses. Lungs:  Clear throughout to auscultation.   No wheezes, crackles, or rhonchi.  Heart:  Regular rate and rhythm; no murmurs. Abdomen:  Soft,  nondistended, nontender. No masses, hepatomegaly. No palpable masses.  Normal bowel sounds.    Rectal:  Deferred   Msk:  Symmetrical without gross deformities. Extremities:  Without edema. Neurologic:  Alert and  oriented x 4; grossly normal neurologically. Skin:  Intact without significant lesions or rashes. Psych:  Alert and cooperative. Normal mood and affect.   Venita Lick. Russella Dar  04/24/2023, 8:02 AM See Loretha Stapler, [facility], to contact our on call provider

## 2023-04-24 NOTE — Progress Notes (Signed)
VS completed by CW.   Pt's states no medical or surgical changes since previsit or office visit.  

## 2023-04-24 NOTE — Progress Notes (Signed)
Called to room to assist during endoscopic procedure.  Patient ID and intended procedure confirmed with present staff. Received instructions for my participation in the procedure from the performing physician.  

## 2023-04-25 ENCOUNTER — Telehealth: Payer: Self-pay

## 2023-04-25 NOTE — Telephone Encounter (Signed)
  Follow up Call-     04/24/2023    7:22 AM  Call back number  Post procedure Call Back phone  # [phone]  Permission to leave phone message Yes     Patient questions:  Do you have a fever, pain , or abdominal swelling? No. Pain Score  0 *  Have you tolerated food without any problems? Yes.    Have you been able to return to your normal activities? Yes.    Do you have any questions about your discharge instructions: Diet   No. Medications  No. Follow up visit  No.  Do you have questions or concerns about your Care? No.  Actions: * If pain score is 4 or above: No action needed, pain <4.

## 2023-05-05 ENCOUNTER — Encounter: Payer: Self-pay | Admitting: Gastroenterology

## 2023-08-03 ENCOUNTER — Ambulatory Visit: Payer: 59 | Admitting: Emergency Medicine

## 2023-08-03 ENCOUNTER — Encounter: Payer: Self-pay | Admitting: Emergency Medicine

## 2023-08-03 ENCOUNTER — Ambulatory Visit: Payer: 59

## 2023-08-03 VITALS — BP 128/88 | HR 72 | Temp 98.3°F | Ht 64.0 in | Wt 264.0 lb

## 2023-08-03 DIAGNOSIS — R053 Chronic cough: Secondary | ICD-10-CM

## 2023-08-03 DIAGNOSIS — J45991 Cough variant asthma: Secondary | ICD-10-CM

## 2023-08-03 DIAGNOSIS — R600 Localized edema: Secondary | ICD-10-CM | POA: Insufficient documentation

## 2023-08-03 LAB — CBC WITH DIFFERENTIAL/PLATELET
Basophils Absolute: 0 10*3/uL (ref 0.0–0.1)
Basophils Relative: 0.8 % (ref 0.0–3.0)
Eosinophils Absolute: 0.1 10*3/uL (ref 0.0–0.7)
Eosinophils Relative: 1.9 % (ref 0.0–5.0)
HCT: 48 % (ref 39.0–52.0)
Hemoglobin: 16.7 g/dL (ref 13.0–17.0)
Lymphocytes Relative: 25.6 % (ref 12.0–46.0)
Lymphs Abs: 1.6 10*3/uL (ref 0.7–4.0)
MCHC: 34.8 g/dL (ref 30.0–36.0)
MCV: 95.4 fL (ref 78.0–100.0)
Monocytes Absolute: 0.5 10*3/uL (ref 0.1–1.0)
Monocytes Relative: 8.5 % (ref 3.0–12.0)
Neutro Abs: 3.9 10*3/uL (ref 1.4–7.7)
Neutrophils Relative %: 63.2 % (ref 43.0–77.0)
Platelets: 294 10*3/uL (ref 150.0–400.0)
RBC: 5.03 Mil/uL (ref 4.22–5.81)
RDW: 13.2 % (ref 11.5–15.5)
WBC: 6.1 10*3/uL (ref 4.0–10.5)

## 2023-08-03 LAB — MICROALBUMIN / CREATININE URINE RATIO
Creatinine,U: 134.2 mg/dL
Microalb Creat Ratio: 0.8 mg/g (ref 0.0–30.0)
Microalb, Ur: 1 mg/dL (ref 0.0–1.9)

## 2023-08-03 LAB — LIPID PANEL
Cholesterol: 181 mg/dL (ref 0–200)
HDL: 40.1 mg/dL (ref >39.00)
LDL Cholesterol: 122 mg/dL — ABNORMAL HIGH (ref 0–99)
NonHDL: 140.47
Total CHOL/HDL Ratio: 5
Triglycerides: 94 mg/dL (ref 0.0–149.0)
VLDL: 18.8 mg/dL (ref 0.0–40.0)

## 2023-08-03 LAB — COMPREHENSIVE METABOLIC PANEL
ALT: 36 U/L (ref 0–53)
AST: 24 U/L (ref 0–37)
Albumin: 3.8 g/dL (ref 3.5–5.2)
Alkaline Phosphatase: 70 U/L (ref 39–117)
BUN: 9 mg/dL (ref 6–23)
CO2: 29 meq/L (ref 19–32)
Calcium: 8.7 mg/dL (ref 8.4–10.5)
Chloride: 103 meq/L (ref 96–112)
Creatinine, Ser: 0.96 mg/dL (ref 0.40–1.50)
GFR: 92.03 mL/min (ref >60.00)
Glucose, Bld: 128 mg/dL — ABNORMAL HIGH (ref 70–99)
Potassium: 4.1 meq/L (ref 3.5–5.1)
Sodium: 137 meq/L (ref 135–145)
Total Bilirubin: 0.6 mg/dL (ref 0.2–1.2)
Total Protein: 6.7 g/dL (ref 6.0–8.3)

## 2023-08-03 LAB — URINALYSIS
Bilirubin Urine: NEGATIVE
Hgb urine dipstick: NEGATIVE
Ketones, ur: NEGATIVE
Leukocytes,Ua: NEGATIVE
Nitrite: NEGATIVE
Specific Gravity, Urine: 1.025 (ref 1.000–1.030)
Total Protein, Urine: NEGATIVE
Urine Glucose: NEGATIVE
Urobilinogen, UA: 1 (ref 0.0–1.0)
pH: 6.5 (ref 5.0–8.0)

## 2023-08-03 LAB — TSH: TSH: 1 u[IU]/mL (ref 0.35–5.50)

## 2023-08-03 LAB — BRAIN NATRIURETIC PEPTIDE: Pro B Natriuretic peptide (BNP): 9 pg/mL (ref 0.0–100.0)

## 2023-08-03 LAB — HEMOGLOBIN A1C: Hgb A1c MFr Bld: 5.9 % (ref 4.6–6.5)

## 2023-08-03 NOTE — Assessment & Plan Note (Signed)
Chronic lower leg edema.  Obesity contributing. No known kidney or liver disease.  No known CHF history EKG normal Chest ray done today along with blood work Recommend cardiology evaluation Blood pressure has been intermittently elevated in the past May be developing hypertensive heart disease May need echocardiogram Cardiology referral placed today.

## 2023-08-03 NOTE — Assessment & Plan Note (Signed)
Unclear diagnosis.  On no medications. Recommend follow-up with pulmonary doctor

## 2023-08-03 NOTE — Assessment & Plan Note (Signed)
Significant amount of weight gain over the past couple of months contributing to symptoms Diet and nutrition discussed Advised to decrease amount of daily carbohydrate intake and daily calories and increase amount of plant-based protein in his diet Benefits of exercise discussed

## 2023-08-03 NOTE — Patient Instructions (Signed)
Peripheral Edema  Peripheral edema is swelling that is caused by a buildup of fluid. Peripheral edema most often affects the lower legs, ankles, and feet. It can also develop in the arms, hands, and face. The area of the body that has peripheral edema will look swollen. It may also feel heavy or warm. Your clothes may start to feel tight. Pressing on the area may make a temporary dent in your skin (pitting edema). You may not be able to move your swollen arm or leg as much as usual. There are many causes of peripheral edema. It can happen because of a complication of other conditions such as heart failure, kidney disease, or a problem with your circulation. It also can be a side effect of certain medicines or happen because of an infection. It often happens to women during pregnancy. Sometimes, the cause is not known. Follow these instructions at home: Managing pain, stiffness, and swelling  Raise (elevate) your legs while you are sitting or lying down. Move around often to prevent stiffness and to reduce swelling. Do not sit or stand for long periods of time. Do not wear tight clothing. Do not wear garters on your upper legs. Exercise your legs to get your circulation going. This helps to move the fluid back into your blood vessels, and it may help the swelling go down. Wear compression stockings as told by your health care provider. These stockings help to prevent blood clots and reduce swelling in your legs. It is important that these are the correct size. These stockings should be prescribed by your doctor to prevent possible injuries. If elastic bandages or wraps are recommended, use them as told by your health care provider. Medicines Take over-the-counter and prescription medicines only as told by your health care provider. Your health care provider may prescribe medicine to help your body get rid of excess water (diuretic). Take this medicine if you are told to take it. General  instructions Eat a low-salt (low-sodium) diet as told by your health care provider. Sometimes, eating less salt may reduce swelling. Pay attention to any changes in your symptoms. Moisturize your skin daily to help prevent skin from cracking and draining. Keep all follow-up visits. This is important. Contact a health care provider if: You have a fever. You have swelling in only one leg. You have increased swelling, redness, or pain in one or both of your legs. You have drainage or sores at the area where you have edema. Get help right away if: You have edema that starts suddenly or is getting worse, especially if you are pregnant or have a medical condition. You develop shortness of breath, especially when you are lying down. You have pain in your chest or abdomen. You feel weak. You feel like you will faint. These symptoms may be an emergency. Get help right away. Call 911. Do not wait to see if the symptoms will go away. Do not drive yourself to the hospital. Summary Peripheral edema is swelling that is caused by a buildup of fluid. Peripheral edema most often affects the lower legs, ankles, and feet. Move around often to prevent stiffness and to reduce swelling. Do not sit or stand for long periods of time. Pay attention to any changes in your symptoms. Contact a health care provider if you have edema that starts suddenly or is getting worse, especially if you are pregnant or have a medical condition. Get help right away if you develop shortness of breath, especially when lying down.   This information is not intended to replace advice given to you by your health care provider. Make sure you discuss any questions you have with your health care provider. Document Revised: 05/17/2021 Document Reviewed: 05/17/2021 Elsevier Patient Education  2024 Elsevier Inc.  

## 2023-08-03 NOTE — Progress Notes (Signed)
Jeremiah Williams 52 y.o.   Chief Complaint  Patient presents with  . Cough    Has been getting worse with in a couple of weeks he has been taking chlorpheniramine.  . Edema    Has been going for a while wife is stating but is just now getting it looked at     HISTORY OF PRESENT ILLNESS: This is a 52 y.o. male complaining of chronic cough on and off for weeks Has a history of chronic cough.  Had full pulmonary evaluation in the past.  Non-smoker.  No history of asthma or COPD.  Felt it was secondary to chronic allergies. On daily antihistamine medication. Also complaining of chronic lower leg edema for at least a couple years.  On and off.  No other associated symptoms. Blood work done 01/18/2023 results reviewed.  Unremarkable labs.  No kidney or liver disease noted.  No diabetes. BP Readings from Last 3 Encounters:  08/03/23 128/88  04/24/23 (!) 149/99  01/17/23 134/86   Wt Readings from Last 3 Encounters:  08/03/23 264 lb (119.7 kg)  04/24/23 225 lb (102.1 kg)  03/29/23 227 lb (103 kg)     Cough Pertinent negatives include no chest pain, chills, fever, headaches, rash, sore throat, shortness of breath or wheezing.     Prior to Admission medications   Medication Sig Start Date End Date Taking? Authorizing Provider  albuterol (PROAIR HFA) 108 (90 Base) MCG/ACT inhaler 2 puffs every 4 hours as needed only  if your can't catch your breath 05/08/18  Yes Nyoka Cowden, MD  chlorpheniramine (CHLOR-TRIMETON) 4 MG tablet Take 4 mg by mouth 2 (two) times daily as needed for allergies.   Yes [provider]    No Known Allergies  Patient Active Problem List   Diagnosis Date Noted  . Morbid obesity due to excess calories (HCC) 11/07/2017  . Cough variant asthma vs UACS from gerd or pnds 12/08/2008  . FATTY LIVER DISEASE 07/17/2008    Past Medical History:  Diagnosis Date  . Allergic rhinitis   . Cough variant asthma    pulmonology--- Rubye Oaks NP (notest in  epic)---- chronic cough  (per pt exercise induced and allergies)  . GERD (gastroesophageal reflux disease)    hx of  . Osteoarthritis    right shoulder  . Seasonal allergies   . Spermatocele   . Wears glasses     Past Surgical History:  Procedure Laterality Date  . COLONOSCOPY  04/24/2023   Claudette Head at [facility]  . INGUINAL HERNIA REPAIR Bilateral child and 1998  . LAPAROSCOPIC CHOLECYSTECTOMY  2013   AND ABDOMINAL HERNIA REPAIR  . SHOULDER ARTHROSCOPY WITH DISTAL CLAVICLE RESECTION Right 09/10/2015   Procedure: RIGHT SHOULDER ARTHROSCOPY DEBRIDEMENT,ACROMIOPLASTY WITH DISTAL CLAVICLE  EXCISION DEBRIDE LABRUM;  Surgeon: Loreta Ave, MD;  Location: [facility];  Service: Orthopedics;  Laterality: Right;  . SPERMATOCELECTOMY Right 04/24/2020   Procedure: SPERMATOCELECTOMY;  Surgeon: Rene Paci, MD;  Location: [facility];  Service: Urology;  Laterality: Right;    Social History   Socioeconomic History  . Marital status: Married    Spouse name: Not on file  . Number of children: Not on file  . Years of education: Not on file  . Highest education level: Some college, no degree  Occupational History  . Not on file  Tobacco Use  . Smoking status: Never  . Smokeless tobacco: Never  Vaping Use  . Vaping status: Never Used  Substance and Sexual Activity  . Alcohol use: Yes    Alcohol/week: 6.0 standard drinks of alcohol    Types: 6 Standard drinks or equivalent per week    Comment: social  . Drug use: Never  . Sexual activity: Not on file  Other Topics Concern  . Not on file  Social History Narrative  . Not on file   Social Determinants of Health   Financial Resource Strain: Low Risk  (07/31/2023)   Overall Financial Resource Strain (CARDIA)   . Difficulty of Paying Living Expenses: Not hard at all  Food Insecurity: No Food Insecurity (07/31/2023)   Hunger Vital Sign   . Worried About Programme researcher, broadcasting/film/video in the Last Year:  Never true   . Ran Out of Food in the Last Year: Never true  Transportation Needs: No Transportation Needs (07/31/2023)   PRAPARE - Transportation   . Lack of Transportation (Medical): No   . Lack of Transportation (Non-Medical): No  Physical Activity: Insufficiently Active (07/31/2023)   Exercise Vital Sign   . Days of Exercise per Week: 3 days   . Minutes of Exercise per Session: 30 min  Stress: No Stress Concern Present (07/31/2023)   [employer] of Occupational Health - Occupational Stress Questionnaire   . Feeling of Stress : Only a little  Social Connections: Moderately Isolated (07/31/2023)   Social Connection and Isolation Panel [NHANES]   . Frequency of Communication with Friends and Family: Once a week   . Frequency of Social Gatherings with Friends and Family: Once a week   . Attends Religious Services: Never   . Active Member of Clubs or Organizations: Yes   . Attends Banker Meetings: 1 to 4 times per year   . Marital Status: Married  Catering manager Violence: Not on file    Family History  Problem Relation Age of Onset  . Cancer Father 55       nasal cavity cancer  . Pancreatic cancer Maternal Uncle 39  . Pancreatic cancer Maternal Uncle 50     Review of Systems  Constitutional: Negative.  Negative for chills and fever.  HENT: Negative.  Negative for congestion and sore throat.   Respiratory:  Positive for cough. Negative for sputum production, shortness of breath and wheezing.   Cardiovascular:  Positive for leg swelling. Negative for chest pain and palpitations.  Gastrointestinal:  Negative for abdominal pain, diarrhea, nausea and vomiting.  Genitourinary: Negative.  Negative for dysuria and hematuria.  Skin: Negative.  Negative for rash.  Neurological: Negative.  Negative for dizziness and headaches.  All other systems reviewed and are negative.   Vitals:   08/03/23 1052  BP: 128/88  Pulse: 72  Temp: 98.3 F (36.8 C)  SpO2: 95%     Physical Exam Vitals reviewed.  Constitutional:      Appearance: Normal appearance. He is obese.  HENT:     Head: Normocephalic.     Mouth/Throat:     Mouth: Mucous membranes are moist.     Pharynx: Oropharynx is clear.  Eyes:     Extraocular Movements: Extraocular movements intact.     Conjunctiva/sclera: Conjunctivae normal.     Pupils: Pupils are equal, round, and reactive to light.  Cardiovascular:     Rate and Rhythm: Normal rate and regular rhythm.     Pulses: Normal pulses.     Heart sounds: Normal heart sounds.  Pulmonary:     Effort: Pulmonary effort is normal.     Breath  sounds: Normal breath sounds.  Abdominal:     Palpations: Abdomen is soft.     Tenderness: There is no abdominal tenderness.  Musculoskeletal:     Cervical back: No tenderness.     Right lower leg: Edema present.     Left lower leg: Edema present.  Lymphadenopathy:     Cervical: No cervical adenopathy.  Skin:    General: Skin is warm and dry.  Neurological:     Mental Status: He is alert and oriented to person, place, and time.  Psychiatric:        Mood and Affect: Mood normal.        Behavior: Behavior normal.    EKG: Normal sinus rhythm with ventricular rate of 64/min.  No acute ischemic changes.  Normal EKG.  ASSESSMENT & PLAN: A total of 43 minutes was spent with the patient and counseling/coordination of care regarding preparing for this visit, review of most recent office visit notes, review of chronic medical conditions under management, review of all medications, review of most recent blood work results, review of today's chest x-ray report, need for blood work, need for pulmonary and cardiology evaluations, prognosis, education on nutrition, documentation and need for follow-up.  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 Recommend follow-up with pulmonary doctor        Other   Morbid  obesity due to excess calories (HCC)    Significant amount of weight gain over the past couple of months contributing to symptoms Diet and nutrition discussed Advised to decrease amount of daily carbohydrate intake and daily calories and increase amount of plant-based protein in his diet Benefits of exercise discussed      Relevant Orders   Urinalysis   Urine Microalbumin w/creat. ratio   CBC with Differential/Platelet   Comprehensive metabolic panel   Hemoglobin A1c   Lipid panel   TSH   DG Chest 2 View   Peripheral edema - Primary    Chronic lower leg edema.  Obesity contributing. No known kidney or liver disease.  No known CHF history EKG normal Chest ray done today along with blood work Recommend cardiology evaluation Blood pressure has been intermittently elevated in the past May be developing hypertensive heart disease May need echocardiogram Cardiology referral placed today.      Relevant Orders   Urinalysis   Urine Microalbumin w/creat. ratio   CBC with Differential/Platelet   Comprehensive metabolic panel   Hemoglobin A1c   Lipid panel   TSH   DG Chest 2 View   B Nat Peptide   EKG 12-Lead   Ambulatory referral to Cardiology   Chronic cough    Unclear diagnosis.  On no medications. Recommend follow-up with pulmonary doctor      Relevant Orders   Urinalysis   Urine Microalbumin w/creat. ratio   CBC with Differential/Platelet   Comprehensive metabolic panel   Hemoglobin A1c   Lipid panel   TSH   DG Chest 2 View   Patient Instructions  Peripheral Edema  Peripheral edema is swelling that is caused by a buildup of fluid. Peripheral edema most often affects the lower legs, ankles, and feet. It can also develop in the arms, hands, and face. The area of the body that has peripheral edema will look swollen. It may also feel heavy or warm. Your clothes may start to feel tight. Pressing on the  area may make a temporary dent in your skin (pitting edema). You may  not be able to move your swollen arm or leg as much as usual. There are many causes of peripheral edema. It can happen because of a complication of other conditions such as heart failure, kidney disease, or a problem with your circulation. It also can be a side effect of certain medicines or happen because of an infection. It often happens to women during pregnancy. Sometimes, the cause is not known. Follow these instructions at home: Managing pain, stiffness, and swelling  Raise (elevate) your legs while you are sitting or lying down. Move around often to prevent stiffness and to reduce swelling. Do not sit or stand for long periods of time. Do not wear tight clothing. Do not wear garters on your upper legs. Exercise your legs to get your circulation going. This helps to move the fluid back into your blood vessels, and it may help the swelling go down. Wear compression stockings as told by your health care provider. These stockings help to prevent blood clots and reduce swelling in your legs. It is important that these are the correct size. These stockings should be prescribed by your doctor to prevent possible injuries. If elastic bandages or wraps are recommended, use them as told by your health care provider. Medicines Take over-the-counter and prescription medicines only as told by your health care provider. Your health care provider may prescribe medicine to help your body get rid of excess water (diuretic). Take this medicine if you are told to take it. General instructions Eat a low-salt (low-sodium) diet as told by your health care provider. Sometimes, eating less salt may reduce swelling. Pay attention to any changes in your symptoms. Moisturize your skin daily to help prevent skin from cracking and draining. Keep all follow-up visits. This is important. Contact a health care provider if: You have a fever. You have swelling in only one leg. You have increased swelling, redness, or  pain in one or both of your legs. You have drainage or sores at the area where you have edema. Get help right away if: You have edema that starts suddenly or is getting worse, especially if you are pregnant or have a medical condition. You develop shortness of breath, especially when you are lying down. You have pain in your chest or abdomen. You feel weak. You feel like you will faint. These symptoms may be an emergency. Get help right away. Call 911. Do not wait to see if the symptoms will go away. Do not drive yourself to the hospital. Summary Peripheral edema is swelling that is caused by a buildup of fluid. Peripheral edema most often affects the lower legs, ankles, and feet. Move around often to prevent stiffness and to reduce swelling. Do not sit or stand for long periods of time. Pay attention to any changes in your symptoms. Contact a health care provider if you have edema that starts suddenly or is getting worse, especially if you are pregnant or have a medical condition. Get help right away if you develop shortness of breath, especially when lying down. This information is not intended to replace advice given to you by your health care provider. Make sure you discuss any questions you have with your health care provider. Document Revised: 05/17/2021 Document Reviewed: 05/17/2021 Elsevier Patient Education  2024 Elsevier Inc.    Edwina Barth, MD [facility]

## 2023-08-03 NOTE — Assessment & Plan Note (Signed)
Returning symptoms. Recommend follow-up with pulmonary doctor

## 2023-08-14 ENCOUNTER — Ambulatory Visit: Payer: 59 | Admitting: Emergency Medicine

## 2023-08-14 ENCOUNTER — Encounter: Payer: Self-pay | Admitting: Emergency Medicine

## 2023-08-14 VITALS — BP 134/90 | HR 78 | Temp 98.6°F | Ht 64.0 in | Wt 263.6 lb

## 2023-08-14 DIAGNOSIS — R590 Localized enlarged lymph nodes: Secondary | ICD-10-CM | POA: Diagnosis not present

## 2023-08-14 DIAGNOSIS — K115 Sialolithiasis: Secondary | ICD-10-CM | POA: Insufficient documentation

## 2023-08-14 NOTE — Patient Instructions (Signed)
Salivary Stone  A salivary stone is a small cluster of mineral (mineral deposit) that builds up in the tubes (ducts) that drain the salivary glands. Most salivary stones are made of calcium. When a stone forms, saliva can back up into the gland and cause painful swelling. Your salivary glands are the glands that make saliva. You have six major salivary glands. Each gland has a duct that carries saliva into your mouth. Saliva keeps your mouth moist and breaks down the food that you eat. It also helps prevent tooth decay. Two salivary glands are found just in front of your ears (parotid). The ducts for these glands open up inside your cheeks, near your back teeth. You also have two glands under your tongue (sublingual) and two glands under your jaw (submandibular). The ducts for these glands open under your tongue. A stone can form in any salivary gland. The most common place for a salivary stone to form is in a submandibular salivary gland. What are the causes? Salivary stones may be caused by any condition that lessens the flow of saliva. It is not known why some people get stones. What increases the risk? You are more likely to develop this condition if: You do not drink enough water. You smoke. You have any of these: High blood pressure. Gout. Diabetes. What are the signs or symptoms? The main sign of a salivary stone is sudden swelling of a salivary gland during eating. This usually happens under the jaw on one side. Other signs and symptoms may include: Swelling of the cheek or under the tongue during eating. Pain in the swollen area. Trouble chewing or swallowing. Swelling that goes down after eating. Sometimes, the salivary stone may be seen. The stone is oval in shape and may be white or yellow in color. How is this diagnosed? This condition may be diagnosed based on: Your signs and symptoms. A physical exam. In many cases, your health care provider will be able to feel the stone in a  duct inside your mouth. Imaging studies, such as: X-rays. Ultrasound. CT scan. MRI. You may need to see an ear, nose, and throat specialist (ENT or otolaryngologist) for diagnosis and treatment. How is this treated? Treatment for this condition depends on the size of the stone. A small stone that is not causing symptoms may be treated with home care. A stone that is large enough to cause symptoms may be treated by: Probing and widening of the duct to let the stone pass. Putting a thin, flexible scope (endoscope) into the duct to find and remove the stone. Breaking up the stone with sound waves. Removing the whole salivary gland. Follow these instructions at home: To relieve discomfort Take NSAIDs, such as ibuprofen, to help relieve pain and swelling as told by your health care provider. Follow these instructions every few hours: Suck on a lemon candy or a vitamin C lozenge to prompt the flow of saliva. Put a warm, damp cloth (warmcompress) over the gland. Gently massage the gland. General instructions  Take over-the-counter and prescription medicines only as told by your health care provider. Drink enough fluid to keep your urine pale yellow. Do not use any products that contain nicotine or tobacco. These products include cigarettes, chewing tobacco, and vaping devices, such as e-cigarettes. If you need help quitting, ask your health care provider. Keep all follow-up visits. This is important. Contact a health care provider if: You have pain and swelling in your face, jaw, or mouth after eating. You  keep having swelling in any of these places: In front of your ear. Under your jaw. Inside your mouth. Get help right away if: You have pain and swelling in your face, jaw, or mouth, that suddenly gets worse. Your pain and swelling make it hard to swallow, talk, or breathe. These symptoms may be an emergency. Get help right away. Call 911. Do not wait to see if the symptoms will go  away. Do not drive yourself to the hospital. Summary A salivary stone is a small clump of mineral (mineral deposit) that builds up in the ducts that drain your salivary glands. When a stone forms, saliva can back up into the gland and cause painful swelling. Salivary stones may be caused by any condition that lessens the flow of saliva. Treatment for this condition depends on the size of the stone. This information is not intended to replace advice given to you by your health care provider. Make sure you discuss any questions you have with your health care provider. Document Revised: 09/08/2021 Document Reviewed: 09/08/2021 Elsevier Patient Education  2024 [employer].

## 2023-08-14 NOTE — Assessment & Plan Note (Signed)
Suspected stones. Acute onset progressively getting better No signs of infection

## 2023-08-14 NOTE — Progress Notes (Signed)
Jeremiah Williams 52 y.o.   Chief Complaint  Patient presents with   Mass    Patient states lump on left of neck,  lymph node area. Patient states lump came up on Tuesday. hurts when he swallows, painful to the touch. Patient states overall he feels fine     HISTORY OF PRESENT ILLNESS: This is a 52 y.o. male complaining of left submandibular gland swelling that started last Friday.  Better today. Had pain in the beginning as well.  No fever.  No other associated symptoms. No other complaints or medical concerns today.  HPI   Prior to Admission medications   Medication Sig Start Date End Date Taking? Authorizing Provider  albuterol (PROAIR HFA) 108 (90 Base) MCG/ACT inhaler 2 puffs every 4 hours as needed only  if your can't catch your breath 05/08/18  Yes Nyoka Cowden, MD  chlorpheniramine (CHLOR-TRIMETON) 4 MG tablet Take 4 mg by mouth 2 (two) times daily as needed for allergies.   Yes [provider]    No Known Allergies  Patient Active Problem List   Diagnosis Date Noted   Peripheral edema 08/03/2023   Chronic cough 08/03/2023   Morbid obesity due to excess calories (HCC) 11/07/2017   Cough variant asthma vs UACS from gerd or pnds 12/08/2008   FATTY LIVER DISEASE 07/17/2008    Past Medical History:  Diagnosis Date   Allergic rhinitis    Cough variant asthma    pulmonology--- Rubye Oaks NP (notest in epic)---- chronic cough  (per pt exercise induced and allergies)   GERD (gastroesophageal reflux disease)    hx of   Osteoarthritis    right shoulder   Seasonal allergies    Spermatocele    Wears glasses     Past Surgical History:  Procedure Laterality Date   COLONOSCOPY  04/24/2023   [facility]aud[facility] Head at [facility]   INGUINAL HERNIA REPAIR Bilateral child and 1998   LAPAROSCOPIC CHOLECYSTECTOMY  2013   AND ABDOMINAL HERNIA REPAIR   SHOULDER ARTHROSCOPY WITH DISTAL CLAVICLE RESECTION Right 09/10/2015   Procedure: RIGHT SHOULDER ARTHROSCOPY  DEBRIDEMENT,ACROMIOPLASTY WITH DISTAL CLAVICLE  EXCISION DEBRIDE LABRUM;  Surgeon: Loreta Ave, MD;  Location: [facility];  Service: Orthopedics;  Laterality: Right;   SPERMATOCELECTOMY Right 04/24/2020   Procedure: SPERMATOCELECTOMY;  Surgeon: Rene Paci, MD;  Location: [facility];  Service: Urology;  Laterality: Right;    Social History   Socioeconomic History   Marital status: Married    Spouse name: Not on file   Number of children: Not on file   Years of education: Not on file   Highest education level: Some college, no degree  Occupational History   Not on file  Tobacco Use   Smoking status: Never   Smokeless tobacco: Never  Vaping Use   Vaping status: Never Used  Substance and Sexual Activity   Alcohol use: Yes    Alcohol/week: 6.0 standard drinks of alcohol    Types: 6 Standard drinks or equivalent per week    Comment: social   Drug use: Never   Sexual activity: Not on file  Other Topics Concern   Not on file  Social History Narrative   Not on file   Social Determinants of Health   Financial Resource Strain: Low Risk  (07/31/2023)   Overall Financial Resource Strain (CARDIA)    Difficulty of Paying Living Expenses: Not hard at all  Food Insecurity: No Food Insecurity (07/31/2023)   Hunger Vital Sign  Worried About Programme researcher, broadcasting/film/video in the Last Year: Never true    Ran Out of Food in the Last Year: Never true  Transportation Needs: No Transportation Needs (07/31/2023)   PRAPARE - Administrator, Civil Service (Medical): No    Lack of Transportation (Non-Medical): No  Physical Activity: Insufficiently Active (07/31/2023)   Exercise Vital Sign    Days of Exercise per Week: 3 days    Minutes of Exercise per Session: 30 min  Stress: No Stress Concern Present (07/31/2023)   [employer] of Occupational Health - Occupational Stress Questionnaire    Feeling of Stress : Only a little  Social  Connections: Moderately Isolated (07/31/2023)   Social Connection and Isolation Panel [NHANES]    Frequency of Communication with Friends and Family: Once a week    Frequency of Social Gatherings with Friends and Family: Once a week    Attends Religious Services: Never    Database administrator or Organizations: Yes    Attends Engineer, structural: 1 to 4 times per year    Marital Status: Married  Catering manager Violence: Not on file    Family History  Problem Relation Age of Onset   Cancer Father 55       nasal cavity cancer   Pancreatic cancer Maternal Uncle 39   Pancreatic cancer Maternal Uncle 50     Review of Systems  Constitutional: Negative.  Negative for chills and fever.  HENT: Negative.  Negative for congestion and sore throat.   Respiratory: Negative.  Negative for cough and shortness of breath.   Cardiovascular: Negative.  Negative for chest pain and palpitations.  Gastrointestinal:  Negative for abdominal pain, diarrhea, nausea and vomiting.  Skin: Negative.  Negative for rash.  Neurological: Negative.  Negative for dizziness and headaches.  All other systems reviewed and are negative.   Vitals:   08/14/23 1531  BP: (!) 134/90  Pulse: 78  Temp: 98.6 F (37 C)  SpO2: 94%    Physical Exam Vitals reviewed.  Constitutional:      Appearance: Normal appearance.  HENT:     Head: Normocephalic.     Mouth/Throat:     Mouth: Mucous membranes are moist.     Pharynx: Oropharynx is clear.  Eyes:     Extraocular Movements: Extraocular movements intact.     Pupils: Pupils are equal, round, and reactive to light.  Cardiovascular:     Rate and Rhythm: Normal rate and regular rhythm.     Pulses: Normal pulses.     Heart sounds: Normal heart sounds.  Pulmonary:     Effort: Pulmonary effort is normal.  Lymphadenopathy:     Head:     Left side of head: Submandibular adenopathy present.     Cervical: No cervical adenopathy.  Skin:    General: Skin is  warm and dry.  Neurological:     Mental Status: He is alert and oriented to person, place, and time.  Psychiatric:        Mood and Affect: Mood normal.        Behavior: Behavior normal.      ASSESSMENT & PLAN: A total of 32 minutes was spent with the patient and counseling/coordination of care regarding preparing for this visit, review of most recent office visit notes, review of most recent blood work results, differential diagnosis of submandibular adenopathy including possibility of salivary gland stone, management, prognosis, documentation and need for follow-up if no better or worse  during the next several days.  Problem List Items Addressed This Visit       Digestive   Sialolithiasis of submandibular gland    Suspected stones. Acute onset progressively getting better No signs of infection         Immune and Lymphatic   Submandibular lymphadenopathy - Primary    Acute onset and slowly getting better.  No red flag signs or symptoms. No signs of infection.  Afebrile. Differential diagnosis discussed. Advised to contact the office if no better or worse during the next several days.      Patient Instructions  Salivary Stone  A salivary stone is a small cluster of mineral (mineral deposit) that builds up in the tubes (ducts) that drain the salivary glands. Most salivary stones are made of calcium. When a stone forms, saliva can back up into the gland and cause painful swelling. Your salivary glands are the glands that make saliva. You have six major salivary glands. Each gland has a duct that carries saliva into your mouth. Saliva keeps your mouth moist and breaks down the food that you eat. It also helps prevent tooth decay. Two salivary glands are found just in front of your ears (parotid). The ducts for these glands open up inside your cheeks, near your back teeth. You also have two glands under your tongue (sublingual) and two glands under your jaw (submandibular). The  ducts for these glands open under your tongue. A stone can form in any salivary gland. The most common place for a salivary stone to form is in a submandibular salivary gland. What are the causes? Salivary stones may be caused by any condition that lessens the flow of saliva. It is not known why some people get stones. What increases the risk? You are more likely to develop this condition if: You do not drink enough water. You smoke. You have any of these: High blood pressure. Gout. Diabetes. What are the signs or symptoms? The main sign of a salivary stone is sudden swelling of a salivary gland during eating. This usually happens under the jaw on one side. Other signs and symptoms may include: Swelling of the cheek or under the tongue during eating. Pain in the swollen area. Trouble chewing or swallowing. Swelling that goes down after eating. Sometimes, the salivary stone may be seen. The stone is oval in shape and may be white or yellow in color. How is this diagnosed? This condition may be diagnosed based on: Your signs and symptoms. A physical exam. In many cases, your health care provider will be able to feel the stone in a duct inside your mouth. Imaging studies, such as: X-rays. Ultrasound. CT scan. MRI. You may need to see an ear, nose, and throat specialist (ENT or otolaryngologist) for diagnosis and treatment. How is this treated? Treatment for this condition depends on the size of the stone. A small stone that is not causing symptoms may be treated with home care. A stone that is large enough to cause symptoms may be treated by: Probing and widening of the duct to let the stone pass. Putting a thin, flexible scope (endoscope) into the duct to find and remove the stone. Breaking up the stone with sound waves. Removing the whole salivary gland. Follow these instructions at home: To relieve discomfort Take NSAIDs, such as ibuprofen, to help relieve pain and swelling as  told by your health care provider. Follow these instructions every few hours: Suck on a lemon candy or a vitamin  C lozenge to prompt the flow of saliva. Put a warm, damp cloth (warmcompress) over the gland. Gently massage the gland. General instructions  Take over-the-counter and prescription medicines only as told by your health care provider. Drink enough fluid to keep your urine pale yellow. Do not use any products that contain nicotine or tobacco. These products include cigarettes, chewing tobacco, and vaping devices, such as e-cigarettes. If you need help quitting, ask your health care provider. Keep all follow-up visits. This is important. Contact a health care provider if: You have pain and swelling in your face, jaw, or mouth after eating. You keep having swelling in any of these places: In front of your ear. Under your jaw. Inside your mouth. Get help right away if: You have pain and swelling in your face, jaw, or mouth, that suddenly gets worse. Your pain and swelling make it hard to swallow, talk, or breathe. These symptoms may be an emergency. Get help right away. Call 911. Do not wait to see if the symptoms will go away. Do not drive yourself to the hospital. Summary A salivary stone is a small clump of mineral (mineral deposit) that builds up in the ducts that drain your salivary glands. When a stone forms, saliva can back up into the gland and cause painful swelling. Salivary stones may be caused by any condition that lessens the flow of saliva. Treatment for this condition depends on the size of the stone. This information is not intended to replace advice given to you by your health care provider. Make sure you discuss any questions you have with your health care provider. Document Revised: 09/08/2021 Document Reviewed: 09/08/2021 Elsevier Patient Education  2024 Elsevier Inc.    Edwina Barth, MD [facility]

## 2023-08-14 NOTE — Assessment & Plan Note (Signed)
Acute onset and slowly getting better.  No red flag signs or symptoms. No signs of infection.  Afebrile. Differential diagnosis discussed. Advised to contact the office if no better or worse during the next several days.

## 2023-10-09 ENCOUNTER — Ambulatory Visit: Payer: 59 | Admitting: Cardiovascular Disease

## 2023-11-22 NOTE — Progress Notes (Unsigned)
 Cardiology Office Note:    Date:  11/24/2023   ID:  Jeremiah Williams, DOB 1970-11-15, MRN [id number]  PCP:  Georgina Quint, MD  Cardiologist:  None  Electrophysiologist:  None   Referring MD: Georgina Quint, *   Chief Complaint  Patient presents with   Leg Swelling    History of Present Illness:    Jeremiah Williams is a 53 y.o. male with a hx of GERD who is referred by Dr. Alvy Bimler for evaluation of lower extremity edema.  Labs on 08/03/2023 notable for proBNP 9, albumin 3.8.  Reports having lower extremity edema.  Denies any chest pain, dyspnea, lightheadedness, syncope, or palpitations. Does not exercise. Never smoked.  No known family history.   Past Medical History:  Diagnosis Date   Allergic rhinitis    Cough variant asthma    pulmonology--- Rubye Oaks NP (notest in epic)---- chronic cough  (per pt exercise induced and allergies)   GERD (gastroesophageal reflux disease)    hx of   Osteoarthritis    right shoulder   Seasonal allergies    Spermatocele    Wears glasses     Past Surgical History:  Procedure Laterality Date   COLONOSCOPY  04/24/2023   Claudette Head at [facility]   INGUINAL HERNIA REPAIR Bilateral child and 1998   LAPAROSCOPIC CHOLECYSTECTOMY  2013   AND ABDOMINAL HERNIA REPAIR   SHOULDER ARTHROSCOPY WITH DISTAL CLAVICLE RESECTION Right 09/10/2015   Procedure: RIGHT SHOULDER ARTHROSCOPY DEBRIDEMENT,ACROMIOPLASTY WITH DISTAL CLAVICLE  EXCISION DEBRIDE LABRUM;  Surgeon: Loreta Ave, MD;  Location: [facility];  Service: Orthopedics;  Laterality: Right;   SPERMATOCELECTOMY Right 04/24/2020   Procedure: SPERMATOCELECTOMY;  Surgeon: Rene Paci, MD;  Location: [facility];  Service: Urology;  Laterality: Right;    Current Medications: Current Meds  Medication Sig   albuterol (PROAIR HFA) 108 (90 Base) MCG/ACT inhaler 2 puffs every 4 hours as needed only  if your can't catch your breath    chlorpheniramine (CHLOR-TRIMETON) 4 MG tablet Take 4 mg by mouth 2 (two) times daily as needed for allergies.     Allergies:   Patient has no known allergies.   Social History   Socioeconomic History   Marital status: Married    Spouse name: Not on file   Number of children: Not on file   Years of education: Not on file   Highest education level: Some college, no degree  Occupational History   Not on file  Tobacco Use   Smoking status: Never   Smokeless tobacco: Never  Vaping Use   Vaping status: Never Used  Substance and Sexual Activity   Alcohol use: Yes    Alcohol/week: 6.0 standard drinks of alcohol    Types: 6 Standard drinks or equivalent per week    Comment: social   Drug use: Never   Sexual activity: Not on file  Other Topics Concern   Not on file  Social History Narrative   Not on file   Social Drivers of Health   Financial Resource Strain: Low Risk  (07/31/2023)   Overall Financial Resource Strain (CARDIA)    Difficulty of Paying Living Expenses: Not hard at all  Food Insecurity: No Food Insecurity (07/31/2023)   Hunger Vital Sign    Worried About Running Out of Food in the Last Year: Never true    Ran Out of Food in the Last Year: Never true  Transportation Needs: No Transportation Needs (07/31/2023)   PRAPARE -  Administrator, Civil Service (Medical): No    Lack of Transportation (Non-Medical): No  Physical Activity: Insufficiently Active (07/31/2023)   Exercise Vital Sign    Days of Exercise per Week: 3 days    Minutes of Exercise per Session: 30 min  Stress: No Stress Concern Present (07/31/2023)   [employer] of Occupational Health - Occupational Stress Questionnaire    Feeling of Stress : Only a little  Social Connections: Moderately Isolated (07/31/2023)   Social Connection and Isolation Panel [NHANES]    Frequency of Communication with Friends and Family: Once a week    Frequency of Social Gatherings with Friends and Family: Once a  week    Attends Religious Services: Never    Database administrator or Organizations: Yes    Attends Engineer, structural: 1 to 4 times per year    Marital Status: Married     Family History: The patient's family history includes Cancer (age of onset: 53) in his father; Pancreatic cancer (age of onset: 85) in his maternal uncle; Pancreatic cancer (age of onset: 71) in his maternal uncle.  ROS:   Please see the history of present illness.     All other systems reviewed and are negative.  EKGs/Labs/Other Studies Reviewed:    The following studies were reviewed today:   EKG:   11/24/2023: Normal sinus rhythm, rate 76, nonspecific T wave flattening  Recent Labs: 08/03/2023: ALT 36; BUN 9; Creatinine, Ser 0.96; Hemoglobin 16.7; Platelets 294.0; Potassium 4.1; Pro B Natriuretic peptide (BNP) 9.0; Sodium 137; TSH 1.00  Recent Lipid Panel    Component Value Date/Time   CHOL 181 08/03/2023 1139   TRIG 94.0 08/03/2023 1139   HDL 40.10 08/03/2023 1139   CHOLHDL 5 08/03/2023 1139   VLDL 18.8 08/03/2023 1139   LDLCALC 122 (H) 08/03/2023 1139    Physical Exam:    VS:  BP 138/83 (Cuff Size: Normal)   Ht 5\' 4"  (1.626 m)   Wt 264 lb 12.8 oz (120.1 kg)   SpO2 95%   BMI 45.45 kg/m     Wt Readings from Last 3 Encounters:  11/24/23 264 lb 12.8 oz (120.1 kg)  08/14/23 263 lb 9.6 oz (119.6 kg)  08/03/23 264 lb (119.7 kg)     GEN:  Well nourished, well developed in no acute distress HEENT: Normal NECK: No JVD; No carotid bruits LYMPHATICS: No lymphadenopathy CARDIAC: RRR, no murmurs, rubs, gallops RESPIRATORY:  Clear to auscultation without rales, wheezing or rhonchi  ABDOMEN: Soft, non-tender, non-distended MUSCULOSKELETAL:  1+ edema; No deformity  SKIN: Warm and dry NEUROLOGIC:  Alert and oriented x 3 PSYCHIATRIC:  Normal affect   ASSESSMENT:    1. Lower extremity edema   2. Nonspecific abnormal electrocardiogram (ECG) (EKG)   3. Snoring   4. Daytime somnolence    5. Morbid obesity (HCC)    PLAN:    Lower extremity edema: 1+ edema on exam.  Labs on 08/03/2023 notable for proBNP 9, albumin 3.8. -Asymmetric, left greater than right, will check lower extremity duplex to rule out DVT -Echocardiogram to rule out structural heart disease -Recommend use of compression stockings  Abnormal EKG: Nonspecific T wave flattening.  Check echocardiogram as above  Snoring/daytime somnolence: Recommend [facility] sleep study.  STOP-BANG 5  Morbid obesity: Body mass index is 45.45 kg/m.  Diet/exercise recommended.  Discussed referral to healthy weight and wellness, states he will consider  RTC in 6 months  Medication Adjustments/Labs and Tests Ordered: Current  medicines are reviewed at length with the patient today.  Concerns regarding medicines are outlined above.  Orders Placed This Encounter  Procedures   EKG 12-Lead   ECHOCARDIOGRAM COMPLETE   Itamar Sleep Study   VAS [place] LOWER EXTREMITY VENOUS (DVT)   No orders of the defined types were placed in this encounter.   Patient Instructions  Medication Instructions:  Continue current medications *If you need a refill on your cardiac medications before your next appointment, please call your pharmacy*   Lab Work: none If you have labs (blood work) drawn today and your tests are completely normal, you will receive your results only by: MyChart Message (if you have MyChart) OR A paper copy in the mail If you have any lab test that is abnormal or we need to change your treatment, we will call you to review the results.   Testing/Procedures: Echo  Your physician has requested that you have an echocardiogram. Echocardiography is a painless test that uses sound waves to create images of your heart. It provides your doctor with information about the size and shape of your heart and how well your heart's chambers and valves are working. This procedure takes approximately one hour. There are no restrictions for  this procedure. Please do NOT wear cologne, perfume, aftershave, or lotions (deodorant is allowed). Please arrive 15 minutes prior to your appointment time.  Please note: We ask at that you not bring children with you during ultrasound (echo/ vascular) testing. Due to room size and safety concerns, children are not allowed in the ultrasound rooms during exams. Our front office staff cannot provide observation of children in our lobby area while testing is being conducted. An adult accompanying a patient to their appointment will only be allowed in the ultrasound room at the discretion of the ultrasound technician under special circumstances. We apologize for any inconvenience.    Itamar  WatchPAT?  Is a FDA cleared portable home sleep study test that uses a watch and 3 points of contact to monitor 7 different channels, including your heart rate, oxygen saturations, body position, snoring, and chest motion.  The study is easy to use from the comfort of your own home and accurately detect sleep apnea.  Before bed, you attach the chest sensor, attached the sleep apnea bracelet to your nondominant hand, and attach the finger probe.  After the study, the raw data is downloaded from the watch and scored for apnea events.   For more information: [email or web address]  Patient Testing Instructions:  Do not put battery into the device until bedtime when you are ready to begin the test. Please call the support number if you need assistance after following the instructions below: 24 hour support line- [phone] or ITAMAR support at [phone] (option 2)  Download the IntelWatchPAT One" app through the google play store or App Store  Be sure to turn on or enable access to bluetooth in settlings on your smartphone/ device  Make sure no other bluetooth devices are on and within the vicinity of your smartphone/ device and WatchPAT watch during testing.  Make sure to leave your smart  phone/ device plugged in and charging all night.  When ready for bed:  Follow the instructions step by step in the WatchPAT One App to activate the testing device. For additional instructions, including video instruction, visit the WatchPAT One video on Youtube. You can search for WatchPat One within Youtube (video is 4 minutes and 18 seconds) or enter: https://youtube/watch?[email or web address]_[email or web address] Please note: You  will be prompted to enter a Pin to connect via bluetooth when starting the test. The PIN will be assigned to you when you receive the test.  The device is disposable, but it recommended that you retain the device until you receive a call letting you know the study has been received and the results have been interpreted.  We will let you know if the study did not transmit to [place] properly after the test is completed. You do not need to call us to confirm the receipt of the test.  Please complete the test within 48 hours of receiving PIN.   Frequently Asked Questions:  What is Watch Dennie Bible one?  A single use fully disposable home sleep apnea testing device and will not need to be returned after completion.  What are the requirements to use WatchPAT one?  The be able to have a successful watchpat one sleep study, you should have your Watch pat one device, your smart phone, watch pat one app, your PIN number and Internet access What type of phone do I need?  You should have a smart phone that uses Android 5.1 and above or any Iphone with IOS 10 and above How can I download the WatchPAT one app?  Based on your device type search for WatchPAT one app either in google play for android devices or APP store for Iphone's Where will I get my PIN for the study?  Your PIN will be provided by your physician's office. It is used for authentication and if you lose/forget your PIN, please reach out to your providers office.  I do not have Internet at home. Can I do WatchPAT one study?  WatchPAT One needs Internet  connection throughout the night to be able to transmit the sleep data. You can use your home/local internet or your cellular's data package. However, it is always recommended to use home/local Internet. It is estimated that between 20MB-30MB will be used with each study.However, the application will be looking for space in the phone to start the study.  What happens if I lose internet or bluetooth connection?  During the internet disconnection, your phone will not be able to transmit the sleep data. All the data, will be stored in your phone. As soon as the internet connection is back on, the phone will being sending the sleep data. During the bluetooth disconnection, WatchPAT one will not be able to to send the sleep data to your phone. Data will be kept in the [facility] one until two devices have bluetooth connection back on. As soon as the connection is back on, WatchPAT one will send the sleep data to the phone.  How long do I need to wear the WatchPAT one?  After you start the study, you should wear the device at least 6 hours.  How far should I keep my phone from the device?  During the night, your phone should be within 15 feet.  What happens if I leave the room for restroom or other reasons?  Leaving the room for any reason will not cause any problem. As soon as your get back to the room, both devices will reconnect and will continue to send the sleep data. Can I use my phone during the sleep study?  Yes, you can use your phone as usual during the study. But it is recommended to put your watchpat one on when you are ready to go to bed.  How will I get my study results?  A  soon as you completed your study, your sleep data will be sent to the provider. They will then share the results with you when they are ready.    Follow-Up: At [facility], you and your health needs are our priority.  As part of our continuing mission to provide you with exceptional heart care, we have created  designated Provider Care Teams.  These Care Teams include your primary Cardiologist (physician) and Advanced Practice Providers (APPs -  Physician Assistants and Nurse Practitioners) who all work together to provide you with the care you need, when you need it.  We recommend signing up for the patient portal called "MyChart".  Sign up information is provided on this After Visit Summary.  MyChart is used to connect with patients for Virtual Visits (Telemedicine).  Patients are able to view lab/test results, encounter notes, upcoming appointments, etc.  Non-urgent messages can be sent to your provider as well.   To learn more about what you can do with MyChart, go to [email or web address].    Your next appointment:   6 month(s)  Provider:   Dr. Bjorn Pippin  Other Instructions Please put compression stocking on in morning and take off at bedtime  Your physician has requested that you have a lower or upper extremity venous duplex. This test is an ultrasound of the veins in the legs. It looks at venous blood flow that carries blood from the heart to the legs or arms. Allow one hour for a Lower Venous exam. Allow thirty minutes for an Upper Venous exam. There are no restrictions or special instructions.  Please note: We ask at that you not bring children with you during ultrasound (echo/ vascular) testing. Due to room size and safety concerns, children are not allowed in the ultrasound rooms during exams. Our front office staff cannot provide observation of children in our lobby area while testing is being conducted. An adult accompanying a patient to their appointment will only be allowed in the ultrasound room at the discretion of the ultrasound technician under special circumstances. We apologize for any inconvenience.        Signed, Little Ishikawa, MD  11/24/2023 5:54 PM    [facility]

## 2023-11-24 ENCOUNTER — Encounter: Payer: Self-pay | Admitting: Cardiology

## 2023-11-24 ENCOUNTER — Ambulatory Visit: Payer: 59 | Attending: Cardiology | Admitting: Cardiology

## 2023-11-24 VITALS — BP 138/83 | Ht 64.0 in | Wt 264.8 lb

## 2023-11-24 DIAGNOSIS — R9431 Abnormal electrocardiogram [ECG] [EKG]: Secondary | ICD-10-CM

## 2023-11-24 DIAGNOSIS — R400 Somnolence: Secondary | ICD-10-CM

## 2023-11-24 DIAGNOSIS — R0683 Snoring: Secondary | ICD-10-CM | POA: Diagnosis not present

## 2023-11-24 DIAGNOSIS — R600 Localized edema: Secondary | ICD-10-CM | POA: Diagnosis not present

## 2023-11-24 NOTE — Patient Instructions (Signed)
 Medication Instructions:  Continue current medications *If you need a refill on your cardiac medications before your next appointment, please call your pharmacy*   Lab Work: none If you have labs (blood work) drawn today and your tests are completely normal, you will receive your results only by: MyChart Message (if you have MyChart) OR A paper copy in the mail If you have any lab test that is abnormal or we need to change your treatment, we will call you to review the results.   Testing/Procedures: Echo  Your physician has requested that you have an echocardiogram. Echocardiography is a painless test that uses sound waves to create images of your heart. It provides your doctor with information about the size and shape of your heart and how well your heart's chambers and valves are working. This procedure takes approximately one hour. There are no restrictions for this procedure. Please do NOT wear cologne, perfume, aftershave, or lotions (deodorant is allowed). Please arrive 15 minutes prior to your appointment time.  Please note: We ask at that you not bring children with you during ultrasound (echo/ vascular) testing. Due to room size and safety concerns, children are not allowed in the ultrasound rooms during exams. Our front office staff cannot provide observation of children in our lobby area while testing is being conducted. An adult accompanying a patient to their appointment will only be allowed in the ultrasound room at the discretion of the ultrasound technician under special circumstances. We apologize for any inconvenience.    Jeremiah Williams  WatchPAT?  Is a FDA cleared portable home sleep study test that uses a watch and 3 points of contact to monitor 7 different channels, including your heart rate, oxygen saturations, body position, snoring, and chest motion.  The study is easy to use from the comfort of your own home and accurately detect sleep apnea.  Before bed, you attach the  chest sensor, attached the sleep apnea bracelet to your nondominant hand, and attach the finger probe.  After the study, the raw data is downloaded from the watch and scored for apnea events.   For more information: [email or web address]  Patient Testing Instructions:  Do not put battery into the device until bedtime when you are ready to begin the test. Please call the support number if you need assistance after following the instructions below: 24 hour support line- [phone] or Jeremiah Williams support at [phone] (option 2)  Download the IntelWatchPAT One" app through the google play store or App Store  Be sure to turn on or enable access to bluetooth in settlings on your smartphone/ device  Make sure no other bluetooth devices are on and within the vicinity of your smartphone/ device and WatchPAT watch during testing.  Make sure to leave your smart phone/ device plugged in and charging all night.  When ready for bed:  Follow the instructions step by step in the WatchPAT One App to activate the testing device. For additional instructions, including video instruction, visit the WatchPAT One video on Youtube. You can search for WatchPat One within Youtube (video is 4 minutes and 18 seconds) or enter: https://youtube/watch?[email or web address]_vbiwxE Please note: You will be prompted to enter a Pin to connect via bluetooth when starting the test. The PIN will be assigned to you when you receive the test.  The device is disposable, but it recommended that you retain the device until you receive a call letting you know the study has been received and the results have been interpreted.  We will let  you know if the study did not transmit to [place] properly after the test is completed. You do not need to call us to confirm the receipt of the test.  Please complete the test within 48 hours of receiving PIN.   Frequently Asked Questions:  What is Watch Dennie Bible one?  A single use fully disposable home sleep  apnea testing device and will not need to be returned after completion.  What are the requirements to use WatchPAT one?  The be able to have a successful watchpat one sleep study, you should have your Watch pat one device, your smart phone, watch pat one app, your PIN number and Internet access What type of phone do I need?  You should have a smart phone that uses Android 5.1 and above or any Iphone with IOS 10 and above How can I download the WatchPAT one app?  Based on your device type search for WatchPAT one app either in google play for android devices or APP store for Iphone's Where will I get my PIN for the study?  Your PIN will be provided by your physician's office. It is used for authentication and if you lose/forget your PIN, please reach out to your providers office.  I do not have Internet at home. Can I do WatchPAT one study?  WatchPAT One needs Internet connection throughout the night to be able to transmit the sleep data. You can use your home/local internet or your cellular's data package. However, it is always recommended to use home/local Internet. It is estimated that between 20MB-30MB will be used with each study.However, the application will be looking for space in the phone to start the study.  What happens if I lose internet or bluetooth connection?  During the internet disconnection, your phone will not be able to transmit the sleep data. All the data, will be stored in your phone. As soon as the internet connection is back on, the phone will being sending the sleep data. During the bluetooth disconnection, WatchPAT one will not be able to to send the sleep data to your phone. Data will be kept in the [facility] one until two devices have bluetooth connection back on. As soon as the connection is back on, WatchPAT one will send the sleep data to the phone.  How long do I need to wear the WatchPAT one?  After you start the study, you should wear the device at least 6 hours.   How far should I keep my phone from the device?  During the night, your phone should be within 15 feet.  What happens if I leave the room for restroom or other reasons?  Leaving the room for any reason will not cause any problem. As soon as your get back to the room, both devices will reconnect and will continue to send the sleep data. Can I use my phone during the sleep study?  Yes, you can use your phone as usual during the study. But it is recommended to put your watchpat one on when you are ready to go to bed.  How will I get my study results?  A soon as you completed your study, your sleep data will be sent to the provider. They will then share the results with you when they are ready.    Follow-Up: At [facility], you and your health needs are our priority.  As part of our continuing mission to provide you with exceptional heart care, we have created designated Provider  Care Teams.  These Care Teams include your primary Cardiologist (physician) and Advanced Practice Providers (APPs -  Physician Assistants and Nurse Practitioners) who all work together to provide you with the care you need, when you need it.  We recommend signing up for the patient portal called "MyChart".  Sign up information is provided on this After Visit Summary.  MyChart is used to connect with patients for Virtual Visits (Telemedicine).  Patients are able to view lab/test results, encounter notes, upcoming appointments, etc.  Non-urgent messages can be sent to your provider as well.   To learn more about what you can do with MyChart, go to [email or web address].    Your next appointment:   6 month(s)  Provider:   Dr. Bjorn Pippin  Other Instructions Please put compression stocking on in morning and take off at bedtime  Your physician has requested that you have a lower or upper extremity venous duplex. This test is an ultrasound of the veins in the legs. It looks at venous blood flow that carries blood  from the heart to the legs or arms. Allow one hour for a Lower Venous exam. Allow thirty minutes for an Upper Venous exam. There are no restrictions or special instructions.  Please note: We ask at that you not bring children with you during ultrasound (echo/ vascular) testing. Due to room size and safety concerns, children are not allowed in the ultrasound rooms during exams. Our front office staff cannot provide observation of children in our lobby area while testing is being conducted. An adult accompanying a patient to their appointment will only be allowed in the ultrasound room at the discretion of the ultrasound technician under special circumstances. We apologize for any inconvenience.

## 2023-12-18 ENCOUNTER — Ambulatory Visit (HOSPITAL_COMMUNITY)
Admission: RE | Admit: 2023-12-18 | Discharge: 2023-12-18 | Disposition: A | Discharge status: Home / Self Care | Payer: 59 | Source: Ambulatory Visit | Attending: Cardiology | Admitting: Cardiology

## 2023-12-18 DIAGNOSIS — R600 Localized edema: Secondary | ICD-10-CM | POA: Diagnosis present

## 2023-12-20 ENCOUNTER — Ambulatory Visit (HOSPITAL_COMMUNITY): Payer: 59 | Attending: Cardiology

## 2023-12-20 ENCOUNTER — Encounter: Payer: Self-pay | Admitting: *Deleted

## 2023-12-20 DIAGNOSIS — R9431 Abnormal electrocardiogram [ECG] [EKG]: Secondary | ICD-10-CM | POA: Insufficient documentation

## 2023-12-20 LAB — ECHOCARDIOGRAM COMPLETE
Area-P 1/2: 4.6 cm2
S' Lateral: 3.1 cm

## 2024-01-08 ENCOUNTER — Telehealth: Payer: Self-pay | Admitting: Cardiology

## 2024-01-08 NOTE — Telephone Encounter (Signed)
 Wife is calling in about the patient sleep study. Please advise

## 2024-01-09 ENCOUNTER — Telehealth: Payer: Self-pay

## 2024-01-09 NOTE — Telephone Encounter (Addendum)
**Note De-Identified Ethleen Lormand Obfuscation** Ordering provider: Dr Alda Amas Associated diagnoses: Snoring-R06.83 and Somnolence-R40.0  WatchPAT PA obtained on 01/09/2024 by Claira Jeter, Isabella Mao, LPN. Authorization: Per the [facility] Provider Portal: Prior Authorization/Notification is not required for the requested service(s) CPT Code: 16109 (Itamar-HST). Decision ID #: [id number]  Patient notified of PIN (1234) on 01/09/2024 Whisper [place] Notification Method: phone. The pt stated that he was not given a WatchPAT One-HST device while in the office. I advised him that we will leave him a WatchPAT One-HST device in the front office at Dr Sudie Ely office for him or his wife to pick up. I did explain how to use the WatchPAT One-HST device to the pt and I advised him to call us  if he has any questions or concerns and he is aware that there is a 1-800 number on the WatchPAT One-HST Device box that he can call if he requires assistance while the office is closed.  Phone note routed to covering staff for follow-up.

## 2024-01-09 NOTE — Telephone Encounter (Signed)
 Ordering provider: Alda Amas Associated diagnoses: Snoring, Daytime Somnolence WatchPAT PA obtained on 01/09/2024 by Maceo Sax, LPN. Authorization: Yes; tracking ID Covered Benefit Patient notified of PIN (1234) on 01/09/2024 via Notification Method: phone.

## 2024-01-11 NOTE — Telephone Encounter (Signed)
 Called and patient verbalized he will pick up sleep study, watchpat today.

## 2024-01-12 NOTE — Telephone Encounter (Signed)
 Patient picked up [facility] Sleep Test 4/17

## 2024-01-18 ENCOUNTER — Ambulatory Visit: Payer: 59 | Admitting: Emergency Medicine

## 2024-01-18 ENCOUNTER — Encounter: Payer: Self-pay | Admitting: Emergency Medicine

## 2024-01-18 VITALS — BP 118/84 | HR 63 | Temp 98.5°F | Ht 64.0 in | Wt 260.0 lb

## 2024-01-18 DIAGNOSIS — Z130 Encounter for screening for diseases of the blood and blood-forming organs and certain disorders involving the immune mechanism: Secondary | ICD-10-CM | POA: Diagnosis not present

## 2024-01-18 DIAGNOSIS — Z13228 Encounter for screening for other metabolic disorders: Secondary | ICD-10-CM

## 2024-01-18 DIAGNOSIS — Z1329 Encounter for screening for other suspected endocrine disorder: Secondary | ICD-10-CM

## 2024-01-18 DIAGNOSIS — Z125 Encounter for screening for malignant neoplasm of prostate: Secondary | ICD-10-CM

## 2024-01-18 DIAGNOSIS — Z0000 Encounter for general adult medical examination without abnormal findings: Secondary | ICD-10-CM | POA: Diagnosis not present

## 2024-01-18 DIAGNOSIS — Z13220 Encounter for screening for lipoid disorders: Secondary | ICD-10-CM | POA: Diagnosis not present

## 2024-01-18 LAB — CBC WITH DIFFERENTIAL/PLATELET
Basophils Absolute: 0 10*3/uL (ref 0.0–0.1)
Basophils Relative: 0.8 % (ref 0.0–3.0)
Eosinophils Absolute: 0.1 10*3/uL (ref 0.0–0.7)
Eosinophils Relative: 2.2 % (ref 0.0–5.0)
HCT: 48.1 % (ref 39.0–52.0)
Hemoglobin: 16.7 g/dL (ref 13.0–17.0)
Lymphocytes Relative: 25.9 % (ref 12.0–46.0)
Lymphs Abs: 1.6 10*3/uL (ref 0.7–4.0)
MCHC: 34.7 g/dL (ref 30.0–36.0)
MCV: 95.6 fl (ref 78.0–100.0)
Monocytes Absolute: 0.5 10*3/uL (ref 0.1–1.0)
Monocytes Relative: 8.1 % (ref 3.0–12.0)
Neutro Abs: 3.8 10*3/uL (ref 1.4–7.7)
Neutrophils Relative %: 63 % (ref 43.0–77.0)
Platelets: 256 10*3/uL (ref 150.0–400.0)
RBC: 5.03 Mil/uL (ref 4.22–5.81)
RDW: 13.3 % (ref 11.5–15.5)
WBC: 6.1 10*3/uL (ref 4.0–10.5)

## 2024-01-18 LAB — TSH: TSH: 0.96 u[IU]/mL (ref 0.35–5.50)

## 2024-01-18 LAB — COMPREHENSIVE METABOLIC PANEL WITH GFR
ALT: 48 U/L (ref 0–53)
AST: 25 U/L (ref 0–37)
Albumin: 3.9 g/dL (ref 3.5–5.2)
Alkaline Phosphatase: 75 U/L (ref 39–117)
BUN: 9 mg/dL (ref 6–23)
CO2: 31 meq/L (ref 19–32)
Calcium: 8.7 mg/dL (ref 8.4–10.5)
Chloride: 103 meq/L (ref 96–112)
Creatinine, Ser: 0.89 mg/dL (ref 0.40–1.50)
GFR: 99.45 mL/min (ref >60.00)
Glucose, Bld: 126 mg/dL — ABNORMAL HIGH (ref 70–99)
Potassium: 4.7 meq/L (ref 3.5–5.1)
Sodium: 138 meq/L (ref 135–145)
Total Bilirubin: 0.8 mg/dL (ref 0.2–1.2)
Total Protein: 6.4 g/dL (ref 6.0–8.3)

## 2024-01-18 LAB — LIPID PANEL
Cholesterol: 187 mg/dL (ref 0–200)
HDL: 40.9 mg/dL (ref >39.00)
LDL Cholesterol: 125 mg/dL — ABNORMAL HIGH (ref 0–99)
NonHDL: 146.12
Total CHOL/HDL Ratio: 5
Triglycerides: 105 mg/dL (ref 0.0–149.0)
VLDL: 21 mg/dL (ref 0.0–40.0)

## 2024-01-18 LAB — HEMOGLOBIN A1C: Hgb A1c MFr Bld: 5.9 % (ref 4.6–6.5)

## 2024-01-18 LAB — PSA: PSA: 0.33 ng/mL (ref 0.10–4.00)

## 2024-01-18 NOTE — Patient Instructions (Signed)
 Health Maintenance, Male  Adopting a healthy lifestyle and getting preventive care are important in promoting health and wellness. Ask your health care provider about:  The right schedule for you to have regular tests and exams.  Things you can do on your own to prevent diseases and keep yourself healthy.  What should I know about diet, weight, and exercise?  Eat a healthy diet    Eat a diet that includes plenty of vegetables, fruits, low-fat dairy products, and lean protein.  Do not eat a lot of foods that are high in solid fats, added sugars, or sodium.  Maintain a healthy weight  Body mass index (BMI) is a measurement that can be used to identify possible weight problems. It estimates body fat based on height and weight. Your health care provider can help determine your BMI and help you achieve or maintain a healthy weight.  Get regular exercise  Get regular exercise. This is one of the most important things you can do for your health. Most adults should:  Exercise for at least 150 minutes each week. The exercise should increase your heart rate and make you sweat (moderate-intensity exercise).  Do strengthening exercises at least twice a week. This is in addition to the moderate-intensity exercise.  Spend less time sitting. Even light physical activity can be beneficial.  Watch cholesterol and blood lipids  Have your blood tested for lipids and cholesterol at 51 years of age, then have this test every 5 years.  You may need to have your cholesterol levels checked more often if:  Your lipid or cholesterol levels are high.  You are older than 51 years of age.  You are at high risk for heart disease.  What should I know about cancer screening?  Many types of cancers can be detected early and may often be prevented. Depending on your health history and family history, you may need to have cancer screening at various ages. This may include screening for:  Colorectal cancer.  Prostate cancer.  Skin cancer.  Lung  cancer.  What should I know about heart disease, diabetes, and high blood pressure?  Blood pressure and heart disease  High blood pressure causes heart disease and increases the risk of stroke. This is more likely to develop in people who have high blood pressure readings or are overweight.  Talk with your health care provider about your target blood pressure readings.  Have your blood pressure checked:  Every 3-5 years if you are 51-95 years of age.  Every year if you are 51 years old or older.  If you are between the ages of 29 and 29 and are a current or former smoker, ask your health care provider if you should have a one-time screening for abdominal aortic aneurysm (AAA).  Diabetes  Have regular diabetes screenings. This checks your fasting blood sugar level. Have the screening done:  Once every three years after age 51 if you are at a normal weight and have a low risk for diabetes.  More often and at a younger age if you are overweight or have a high risk for diabetes.  What should I know about preventing infection?  Hepatitis B  If you have a higher risk for hepatitis B, you should be screened for this virus. Talk with your health care provider to find out if you are at risk for hepatitis B infection.  Hepatitis C  Blood testing is recommended for:  Everyone born from [birth year] through 1965.  Anyone  with known risk factors for hepatitis C.  Sexually transmitted infections (STIs)  You should be screened each year for STIs, including gonorrhea and chlamydia, if:  You are sexually active and are younger than 51 years of age.  You are older than 51 years of age and your health care provider tells you that you are at risk for this type of infection.  Your sexual activity has changed since you were last screened, and you are at increased risk for chlamydia or gonorrhea. Ask your health care provider if you are at risk.  Ask your health care provider about whether you are at high risk for HIV. Your health care provider  may recommend a prescription medicine to help prevent HIV infection. If you choose to take medicine to prevent HIV, you should first get tested for HIV. You should then be tested every 3 months for as long as you are taking the medicine.  Follow these instructions at home:  Alcohol use  Do not drink alcohol if your health care provider tells you not to drink.  If you drink alcohol:  Limit how much you have to 0-2 drinks a day.  Know how much alcohol is in your drink. In the [place] one drink equals one 12 oz bottle of beer (355 mL), one 5 oz glass of wine (148 mL), or one 1 oz glass of hard liquor (44 mL).  Lifestyle  Do not use any products that contain nicotine or tobacco. These products include cigarettes, chewing tobacco, and vaping devices, such as e-cigarettes. If you need help quitting, ask your health care provider.  Do not use street drugs.  Do not share needles.  Ask your health care provider for help if you need support or information about quitting drugs.  General instructions  Schedule regular health, dental, and eye exams.  Stay current with your vaccines.  Tell your health care provider if:  You often feel depressed.  You have ever been abused or do not feel safe at home.  Summary  Adopting a healthy lifestyle and getting preventive care are important in promoting health and wellness.  Follow your health care provider's instructions about healthy diet, exercising, and getting tested or screened for diseases.  Follow your health care provider's instructions on monitoring your cholesterol and blood pressure.  This information is not intended to replace advice given to you by your health care provider. Make sure you discuss any questions you have with your health care provider.  Document Revised: 02/01/2021 Document Reviewed: 02/01/2021  Elsevier Patient Education  2024 [employer].

## 2024-01-18 NOTE — Progress Notes (Signed)
 Jeremiah Williams 53 y.o.   Chief Complaint  Patient presents with   Annual Exam    Physical . Patient has no other concerns     HISTORY OF PRESENT ILLNESS: This is a 53 y.o. male here for annual exam. Has no complaints or medical concerns today.  HPI   Prior to Admission medications   Medication Sig Start Date End Date Taking? Authorizing Provider  albuterol  (PROAIR  HFA) 108 (90 Base) MCG/ACT inhaler 2 puffs every 4 hours as needed only  if your can't catch your breath 05/08/18  Yes Diamond Formica, MD  chlorpheniramine (CHLOR-TRIMETON) 4 MG tablet Take 4 mg by mouth 2 (two) times daily as needed for allergies.   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 12/08/2008   FATTY LIVER DISEASE 07/17/2008    Past Medical History:  Diagnosis Date   Allergic rhinitis    Cough variant asthma    pulmonology--- Roena Clark NP (notest in epic)---- chronic cough  (per pt exercise induced and allergies)   GERD (gastroesophageal reflux disease)    hx of   Osteoarthritis    right shoulder   Seasonal allergies    Spermatocele    Wears glasses     Past Surgical History:  Procedure Laterality Date   COLONOSCOPY  04/24/2023   S[facility]ann Crea at [facility]   INGUINAL HERNIA REPAIR Bilateral child and 1998   LAPAROSCOPIC CHOLECYSTECTOMY  2013   AND ABDOMINAL HERNIA REPAIR   SHOULDER ARTHROSCOPY WITH DISTAL CLAVICLE RESECTION Right 09/10/2015   Procedure: RIGHT SHOULDER ARTHROSCOPY DEBRIDEMENT,ACROMIOPLASTY WITH DISTAL CLAVICLE  EXCISION DEBRIDE LABRUM;  Surgeon: Ferd Householder, MD;  Location: [facility];  Service: Orthopedics;  Laterality: Right;    SPERMATOCELECTOMY Right 04/24/2020   Procedure: SPERMATOCELECTOMY;  Surgeon: Adelbert Homans, MD;  Location: [facility];  Service: Urology;  Laterality: Right;    Social History   Socioeconomic History   Marital status: Married    Spouse name: Not on file   Number of children: Not on file   Years of education: Not on file   Highest education level: Some college, no degree  Occupational History   Not on file  Tobacco Use   Smoking status: Never   Smokeless tobacco: Never  Vaping Use   Vaping status: Never Used  Substance and Sexual Activity   Alcohol use: Yes    Alcohol/week: 6.0 standard drinks of alcohol    Types: 6 Standard drinks or equivalent per week    Comment: social   Drug use: Never   Sexual activity: Not on file  Other Topics Concern   Not on file  Social History Narrative   Not on file   Social Drivers of Health   Financial Resource Strain: Low Risk  (07/31/2023)   Overall Financial Resource Strain (CARDIA)    Difficulty of Paying Living Expenses: Not hard at all  Food Insecurity: No Food Insecurity (07/31/2023)   Hunger Vital Sign    Worried About Running Out of Food in the Last Year: Never true    Ran Out of Food in the Last Year: Never true  Transportation Needs: No Transportation Needs (07/31/2023)  PRAPARE - Administrator, Civil Service (Medical): No    Lack of Transportation (Non-Medical): No  Physical Activity: Insufficiently Active (07/31/2023)   Exercise Vital Sign    Days of Exercise per Week: 3 days    Minutes of Exercise per Session: 30 min  Stress: No Stress Concern Present (07/31/2023)   [employer] of Occupational Health - Occupational Stress Questionnaire    Feeling of Stress : Only a little  Social Connections: Moderately Isolated (07/31/2023)   Social Connection and Isolation Panel [NHANES]    Frequency of Communication with Friends and Family: Once a week    Frequency of Social Gatherings with  Friends and Family: Once a week    Attends Religious Services: Never    Database administrator or Organizations: Yes    Attends Engineer, structural: 1 to 4 times per year    Marital Status: Married  Catering manager Violence: Not on file    Family History  Problem Relation Age of Onset   Cancer Father 55       nasal cavity cancer   Pancreatic cancer Maternal Uncle 39   Pancreatic cancer Maternal Uncle 50     Review of Systems  Constitutional: Negative.  Negative for chills and fever.  HENT: Negative.  Negative for congestion and sore throat.   Respiratory: Negative.  Negative for cough and shortness of breath.   Cardiovascular: Negative.  Negative for chest pain and palpitations.  Gastrointestinal:  Negative for abdominal pain, diarrhea, nausea and vomiting.  Genitourinary: Negative.  Negative for dysuria and hematuria.  Skin: Negative.  Negative for rash.  Neurological: Negative.  Negative for dizziness and headaches.  All other systems reviewed and are negative.   Vitals:   01/18/24 1000  BP: 118/84  Pulse: 63  Temp: 98.5 F (36.9 C)  SpO2: 94%    Physical Exam Vitals reviewed.  Constitutional:      Appearance: Normal appearance.  HENT:     Head: Normocephalic.     Right Ear: Tympanic membrane, ear canal and external ear normal.     Left Ear: Tympanic membrane, ear canal and external ear normal.     Mouth/Throat:     Mouth: Mucous membranes are moist.     Pharynx: Oropharynx is clear.  Eyes:     Extraocular Movements: Extraocular movements intact.     Conjunctiva/sclera: Conjunctivae normal.     Pupils: Pupils are equal, round, and reactive to light.  Cardiovascular:     Rate and Rhythm: Normal rate and regular rhythm.     Pulses: Normal pulses.     Heart sounds: Normal heart sounds.  Pulmonary:     Effort: Pulmonary effort is normal.     Breath sounds: Normal breath sounds.  Abdominal:     Palpations: Abdomen is soft.     Tenderness: There is  no abdominal tenderness.  Musculoskeletal:     Cervical back: No tenderness.  Lymphadenopathy:     Cervical: No cervical adenopathy.  Skin:    General: Skin is warm and dry.     Capillary Refill: Capillary refill takes less than 2 seconds.  Neurological:     General: No focal deficit present.     Mental Status: He is alert and oriented to person, place, and time.  Psychiatric:        Mood and Affect: Mood normal.        Behavior: Behavior normal.      ASSESSMENT & PLAN: Problem List  Items Addressed This Visit   None Visit Diagnoses       Routine general medical examination at a health care facility    -  Primary   Relevant Orders   CBC with Differential/Platelet   Comprehensive metabolic panel with GFR   PSA   Hemoglobin A1c   Lipid panel   TSH     Screening for deficiency anemia       Relevant Orders   CBC with Differential/Platelet     Screening for lipoid disorders       Relevant Orders   Lipid panel     Screening for endocrine, metabolic and immunity disorder       Relevant Orders   Comprehensive metabolic panel with GFR   Hemoglobin A1c   TSH     Screening for prostate cancer       Relevant Orders   PSA      Modifiable risk factors discussed with patient. Anticipatory guidance according to age provided. The following topics were also discussed: Social Determinants of Health Smoking.  Non-smoker Diet and nutrition and need to decrease amount of daily carbohydrate intake and daily calories and increase amount of plant-based protein in his diet Benefits of exercise Cancer screening and review of most recent colonoscopy report Vaccinations review and recommendations Cardiovascular risk assessment and need for blood work The 10-year ASCVD risk score (Arnett DK, et al., 2019) is: 3.6%   Values used to calculate the score:     Age: 60 years     Sex: Male     Is Non-Hispanic African American: No     Diabetic: No     Tobacco smoker: No     Systolic Blood  Pressure: 118 mmHg     Is BP treated: No     HDL Cholesterol: 40.1 mg/dL     Total Cholesterol: 181 mg/dL  Mental health including depression and anxiety Fall and accident prevention  Patient Instructions  Health Maintenance, Male Adopting a healthy lifestyle and getting preventive care are important in promoting health and wellness. Ask your health care provider about: The right schedule for you to have regular tests and exams. Things you can do on your own to prevent diseases and keep yourself healthy. What should I know about diet, weight, and exercise? Eat a healthy diet  Eat a diet that includes plenty of vegetables, fruits, low-fat dairy products, and lean protein. Do not eat a lot of foods that are high in solid fats, added sugars, or sodium. Maintain a healthy weight Body mass index (BMI) is a measurement that can be used to identify possible weight problems. It estimates body fat based on height and weight. Your health care provider can help determine your BMI and help you achieve or maintain a healthy weight. Get regular exercise Get regular exercise. This is one of the most important things you can do for your health. Most adults should: Exercise for at least 150 minutes each week. The exercise should increase your heart rate and make you sweat (moderate-intensity exercise). Do strengthening exercises at least twice a week. This is in addition to the moderate-intensity exercise. Spend less time sitting. Even light physical activity can be beneficial. Watch cholesterol and blood lipids Have your blood tested for lipids and cholesterol at 51 years of age, then have this test every 5 years. You may need to have your cholesterol levels checked more often if: Your lipid or cholesterol levels are high. You are older than 51 years of age.  You are at high risk for heart disease. What should I know about cancer screening? Many types of cancers can be detected early and may often be  prevented. Depending on your health history and family history, you may need to have cancer screening at various ages. This may include screening for: Colorectal cancer. Prostate cancer. Skin cancer. Lung cancer. What should I know about heart disease, diabetes, and high blood pressure? Blood pressure and heart disease High blood pressure causes heart disease and increases the risk of stroke. This is more likely to develop in people who have high blood pressure readings or are overweight. Talk with your health care provider about your target blood pressure readings. Have your blood pressure checked: Every 3-5 years if you are 48-71 years of age. Every year if you are 5 years old or older. If you are between the ages of 71 and 44 and are a current or former smoker, ask your health care provider if you should have a one-time screening for abdominal aortic aneurysm (AAA). Diabetes Have regular diabetes screenings. This checks your fasting blood sugar level. Have the screening done: Once every three years after age 55 if you are at a normal weight and have a low risk for diabetes. More often and at a younger age if you are overweight or have a high risk for diabetes. What should I know about preventing infection? Hepatitis B If you have a higher risk for hepatitis B, you should be screened for this virus. Talk with your health care provider to find out if you are at risk for hepatitis B infection. Hepatitis C Blood testing is recommended for: Everyone born from [birth year] through 1965. Anyone with known risk factors for hepatitis C. Sexually transmitted infections (STIs) You should be screened each year for STIs, including gonorrhea and chlamydia, if: You are sexually active and are younger than 51 years of age. You are older than 51 years of age and your health care provider tells you that you are at risk for this type of infection. Your sexual activity has changed since you were last screened,  and you are at increased risk for chlamydia or gonorrhea. Ask your health care provider if you are at risk. Ask your health care provider about whether you are at high risk for HIV. Your health care provider may recommend a prescription medicine to help prevent HIV infection. If you choose to take medicine to prevent HIV, you should first get tested for HIV. You should then be tested every 3 months for as long as you are taking the medicine. Follow these instructions at home: Alcohol use Do not drink alcohol if your health care provider tells you not to drink. If you drink alcohol: Limit how much you have to 0-2 drinks a day. Know how much alcohol is in your drink. In the [place] one drink equals one 12 oz bottle of beer (355 mL), one 5 oz glass of wine (148 mL), or one 1 oz glass of hard liquor (44 mL). Lifestyle Do not use any products that contain nicotine or tobacco. These products include cigarettes, chewing tobacco, and vaping devices, such as e-cigarettes. If you need help quitting, ask your health care provider. Do not use street drugs. Do not share needles. Ask your health care provider for help if you need support or information about quitting drugs. General instructions Schedule regular health, dental, and eye exams. Stay current with your vaccines. Tell your health care provider if: You often feel depressed. You  have ever been abused or do not feel safe at home. Summary Adopting a healthy lifestyle and getting preventive care are important in promoting health and wellness. Follow your health care provider's instructions about healthy diet, exercising, and getting tested or screened for diseases. Follow your health care provider's instructions on monitoring your cholesterol and blood pressure. This information is not intended to replace advice given to you by your health care provider. Make sure you discuss any questions you have with your health care provider. Document Revised:  02/01/2021 Document Reviewed: 02/01/2021 Elsevier Patient Education  2024 Elsevier Inc.     Maryagnes Small, MD [facility]

## 2024-01-19 ENCOUNTER — Encounter (INDEPENDENT_AMBULATORY_CARE_PROVIDER_SITE_OTHER): Payer: Self-pay | Admitting: Cardiology

## 2024-01-19 DIAGNOSIS — G4733 Obstructive sleep apnea (adult) (pediatric): Secondary | ICD-10-CM | POA: Diagnosis not present

## 2024-01-23 ENCOUNTER — Ambulatory Visit: Attending: Cardiology

## 2024-01-23 DIAGNOSIS — R0683 Snoring: Secondary | ICD-10-CM

## 2024-01-23 DIAGNOSIS — R400 Somnolence: Secondary | ICD-10-CM

## 2024-01-23 NOTE — Procedures (Signed)
   SLEEP STUDY REPORT Patient Information Study Date: 01/19/2024 Patient Name: Jeremiah Williams Patient ID: [id number] Birth Date: 11/07/1970 Age: 53 Gender: Male BMI: 45.2 (W=264 lb, H=5' 4'') Referring Physician: Carson Clara, MD  TEST DESCRIPTION: Home sleep apnea testing was completed using the WatchPat, a Type 1 device, utilizing peripheral arterial tonometry (PAT), chest movement, actigraphy, pulse oximetry, pulse rate, body position and snore. AHI was calculated with apnea and hypopnea using valid sleep time as the denominator. RDI includes apneas, hypopneas, and RERAs. The data acquired and the scoring of sleep and all associated events were performed in accordance with the recommended standards and specifications as outlined in the AASM Manual for the Scoring of Sleep and Associated Events 2.2.0 (2015).  FINDINGS:  1. Mild Obstructive Sleep Apnea with AHI 13.7/hr overall and severe during REM sleep with REM AHI 36.6/hr.  2. No Central Sleep Apnea with pAHIc 0/hr.  3. Oxygen desaturations as low as 77%.  4. Mild to moderate snoring was present. O2 sats were < 88% for 22.3 min.  5. Total sleep time was 6 hrs and 25 min.  6. 26.6% of total sleep time was spent in REM sleep.  7. Shortened sleep onset latency at 6 min.  8. Shortened REM sleep onset latency at 77 min.  9. Total awakenings were 6. 10. Arrhythmia detection: None  DIAGNOSIS: Mild to Moderate Obstructive Sleep Apnea (G47.33) Nocturnal Hypoxemia  RECOMMENDATIONS: 1. Clinical correlation of these findings is necessary. The decision to treat obstructive sleep apnea (OSA) is usually based on the presence of apnea symptoms or the presence of associated medical conditions such as Hypertension, Congestive Heart Failure, Atrial Fibrillation or Obesity. The most common symptoms of OSA are snoring, gasping for breath while sleeping, daytime sleepiness and fatigue. 2. Initiating apnea therapy is recommended given the  presence of symptoms and/or associated conditions. Recommend proceeding with one of the following:  a. Auto-CPAP therapy with a pressure range of 5-20cm H2O.  b. An oral appliance (OA) that can be obtained from certain dentists with expertise in sleep medicine. These are primarily of use in non-obese patients with mild and moderate disease.  c. An ENT consultation which may be useful to look for specific causes of obstruction and possible treatment options.  d. If patient is intolerant to PAP therapy, consider referral to ENT for evaluation for hypoglossal nerve stimulator. 3. Close follow-up is necessary to ensure success with CPAP or oral appliance therapy for maximum benefit . 4. A follow-up oximetry study on CPAP is recommended to assess the adequacy of therapy and determine the need for supplemental oxygen or the potential need for Bi-level therapy. An arterial blood gas to determine the adequacy of baseline ventilation and oxygenation should also be considered. 5. Healthy sleep recommendations include: adequate nightly sleep (normal 7-9 hrs/night), avoidance of caffeine after noon and alcohol near bedtime, and maintaining a sleep environment that is cool, dark and quiet. 6. Weight loss for overweight patients is recommended. Even modest amounts of weight loss can significantly improve the severity of sleep apnea. 7. Snoring recommendations include: weight loss where appropriate, side sleeping, and avoidance of alcohol before bed. 8. Operation of motor vehicle should be avoided when sleepy.  Signature: Gaylyn Keas, MD; [facility]; Diplomat, American Board of Sleep Medicine Electronically Signed: 01/23/2024 8:22:18 PM

## 2024-01-24 ENCOUNTER — Telehealth: Payer: Self-pay

## 2024-01-24 DIAGNOSIS — G4733 Obstructive sleep apnea (adult) (pediatric): Secondary | ICD-10-CM

## 2024-01-24 DIAGNOSIS — R400 Somnolence: Secondary | ICD-10-CM

## 2024-01-24 DIAGNOSIS — R600 Localized edema: Secondary | ICD-10-CM

## 2024-01-24 DIAGNOSIS — R0683 Snoring: Secondary | ICD-10-CM

## 2024-01-24 NOTE — Telephone Encounter (Signed)
-----   Message from Gaylyn Keas sent at 01/23/2024  8:23 PM EDT ----- Please let patient know that they have sleep apnea.  Recommend therapeutic CPAP titration for treatment of patient's sleep disordered breathing.

## 2024-01-24 NOTE — Telephone Encounter (Signed)
 Notified patient of sleep study results and recommendations. All questions were answered and patient verbalized understanding. CPAP Titration ordered today.

## 2024-01-24 NOTE — Addendum Note (Signed)
 Addended by: Deloma Spindle C on: 01/24/2024 09:30 AM   Modules accepted: Orders

## 2024-01-24 NOTE — Telephone Encounter (Signed)
 Left callback number for patient to receive sleep study results and recommendations.

## 2024-02-20 ENCOUNTER — Encounter (HOSPITAL_BASED_OUTPATIENT_CLINIC_OR_DEPARTMENT_OTHER): Payer: Self-pay | Admitting: Cardiology

## 2024-02-20 NOTE — Telephone Encounter (Signed)
**Note De-identified  [place] Obfuscation** Please advise 

## 2024-03-26 ENCOUNTER — Telehealth: Payer: Self-pay

## 2024-03-26 NOTE — Telephone Encounter (Signed)
 Second Prior Authorization started 03/26/24 with [facility] Portal, Pending :[id number]

## 2024-04-05 ENCOUNTER — Telehealth: Payer: Self-pay

## 2024-04-05 DIAGNOSIS — R400 Somnolence: Secondary | ICD-10-CM

## 2024-04-05 DIAGNOSIS — G4733 Obstructive sleep apnea (adult) (pediatric): Secondary | ICD-10-CM

## 2024-04-05 DIAGNOSIS — R0683 Snoring: Secondary | ICD-10-CM

## 2024-04-05 NOTE — Telephone Encounter (Signed)
 Notified patient that Titration has been denied twice, per Dr. Shlomo we will order new CPAP Device and supplies. Patient states his wife will call back with final decision.

## 2024-04-05 NOTE — Telephone Encounter (Signed)
-----   Message from Wilbert Bihari sent at 03/27/2024  4:18 PM EDT ----- Regarding: RE: CPAP Titration denied Order ResMed CPAP on auto from 4 to 15cm H2O with heated humidity, mask of choice ----- Message ----- From: Bevely Connell BROCKS, LPN Sent: 11/03/7972   3:01 PM EDT To: Wilbert JONELLE Bihari, MD Subject: CPAP Titration denied                          He has been denied coverage twice for a CPAP Titration. Do you want me place an order for a device?

## 2024-04-16 NOTE — Telephone Encounter (Signed)
 Pt's spouse Sherri requesting cb to her cell phone to update and further discuss Titration.

## 2024-04-30 NOTE — Addendum Note (Signed)
 Addended by: JOSHUA DALTON MATSU on: 04/30/2024 04:23 PM   Modules accepted: Orders

## 2024-04-30 NOTE — Telephone Encounter (Signed)
 Return Call: Patient has decided to move forward with getting his cpap machine.  Upon patient request DME selection is [facility] Patient understands he will be contacted by [facility] to set up his cpap. Patient understands to call if [facility] does not contact him with new setup in a timely manner. Patient understands they will be called once confirmation has been received from [facility] that they have received their new machine to schedule 10 week follow up appointment.   [facility] notified of new cpap order  Please add to airview Patient was grateful for the call and thanked me

## 2024-07-15 ENCOUNTER — Ambulatory Visit (INDEPENDENT_AMBULATORY_CARE_PROVIDER_SITE_OTHER)

## 2024-07-15 ENCOUNTER — Ambulatory Visit: Payer: Self-pay | Admitting: Emergency Medicine

## 2024-07-15 ENCOUNTER — Encounter: Payer: Self-pay | Admitting: Emergency Medicine

## 2024-07-15 ENCOUNTER — Ambulatory Visit: Admitting: Emergency Medicine

## 2024-07-15 VITALS — BP 124/84 | HR 69 | Temp 97.9°F | Ht 64.0 in | Wt 268.6 lb

## 2024-07-15 DIAGNOSIS — G8929 Other chronic pain: Secondary | ICD-10-CM

## 2024-07-15 DIAGNOSIS — M25571 Pain in right ankle and joints of right foot: Secondary | ICD-10-CM | POA: Diagnosis not present

## 2024-07-15 NOTE — Assessment & Plan Note (Signed)
 Clinically stable.  No red flag signs or symptoms. Differential diagnosis discussed Recommend x-rays today.  Will review images when available Pain management discussed Recommend orthopedic evaluation.  Referral to sports medicine placed today.
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
## 2024-07-15 NOTE — Patient Instructions (Signed)
Ankle Pain The ankle joint helps you stand on your leg and allows you to move around. Ankle pain can happen on either side or the back of the ankle. You may have pain in one ankle or both ankles. Ankle pain may be sharp and burning or dull and aching. There may be tenderness, stiffness, redness, or warmth around the ankle. Many things can cause ankle pain. These include an injury to the area and overuse of your ankle. Follow these instructions at home: Activity Rest your ankle as told by your health care provider. Avoid doing things that cause ankle pain. Do not use the injured limb to support your body weight until your provider says that you can. Use crutches as told by your provider. Ask your provider when it is safe to drive if you have a brace on your ankle. Do exercises as told by your provider. If you have a removable brace: Wear the brace as told by your provider. Remove it only as told by your provider. Check the skin around the brace every day. Tell your provider about any concerns. Loosen the brace if your toes tingle, become numb, or turn cold and blue. Keep the brace clean. If the brace is not waterproof: Do not let it get wet. Cover it with a watertight covering when you take a bath or shower. If you have an elastic bandage:  Remove it when you take a bath or a shower. Try not to move your ankle much. Wiggle your toes from time to time. This helps to prevent swelling. Adjust the bandage if it feels too tight. Loosen the bandage if your foot tingles, becomes numb, or turns cold and blue. Managing pain, stiffness, and swelling  If told, put ice on the painful area. If you have a removable brace or elastic bandage, remove it as told by your provider. Put ice in a plastic bag. Place a towel between your skin and the bag. Leave the ice on for 20 minutes, 2-3 times a day. If your skin turns bright red, remove the ice right away to prevent skin damage. The risk of damage is  higher if you cannot feel pain, heat, or cold. Move your toes often to reduce stiffness and swelling. Raise (elevate) your ankle above the level of your heart while you are sitting or lying down. General instructions Take over-the-counter and prescription medicines only as told by your provider. To help you and your provider, write down: How often you have ankle pain. Where the pain is. What the pain feels like. If you are told to wear a certain shoe or insole, make sure you wear it the right way and for as long as you are told. Contact a health care provider if: Your pain gets worse. Your pain does not get better with medicine. You have a fever or chills. You have more trouble walking. You have new symptoms. Your foot, leg, toes, or ankle tingles, becomes numb or swollen, or turns cold and blue. This information is not intended to replace advice given to you by your health care provider. Make sure you discuss any questions you have with your health care provider. Document Revised: 07/06/2022 Document Reviewed: 07/06/2022 Elsevier Patient Education  2024 [employer].
